# Patient Record
Sex: Female | Born: 1985 | Race: White | Hispanic: No | Marital: Married | Smoking: Former smoker
Health system: Southern US, Community
[De-identification: ages and names within clinical notes are randomized; demographics above are authoritative.]

## PROBLEM LIST (undated history)

## (undated) DIAGNOSIS — K219 Gastro-esophageal reflux disease without esophagitis: Secondary | ICD-10-CM

## (undated) DIAGNOSIS — F419 Anxiety disorder, unspecified: Secondary | ICD-10-CM

## (undated) DIAGNOSIS — I10 Essential (primary) hypertension: Secondary | ICD-10-CM

## (undated) DIAGNOSIS — F329 Major depressive disorder, single episode, unspecified: Secondary | ICD-10-CM

## (undated) DIAGNOSIS — F32A Depression, unspecified: Secondary | ICD-10-CM

## (undated) DIAGNOSIS — E7800 Pure hypercholesterolemia, unspecified: Secondary | ICD-10-CM

## (undated) DIAGNOSIS — D649 Anemia, unspecified: Secondary | ICD-10-CM

## (undated) DIAGNOSIS — E039 Hypothyroidism, unspecified: Secondary | ICD-10-CM

## (undated) DIAGNOSIS — E669 Obesity, unspecified: Secondary | ICD-10-CM

## (undated) HISTORY — DX: Major depressive disorder, single episode, unspecified: F32.9

## (undated) HISTORY — PX: ABDOMINAL HYSTERECTOMY: SHX81

## (undated) HISTORY — PX: MANDIBLE FRACTURE SURGERY: SHX706

## (undated) HISTORY — PX: BREAST BIOPSY: SHX20

## (undated) HISTORY — DX: Anxiety disorder, unspecified: F41.9

## (undated) HISTORY — DX: Obesity, unspecified: E66.9

## (undated) HISTORY — DX: Depression, unspecified: F32.A

---

## 2004-07-22 ENCOUNTER — Emergency Department: Payer: Self-pay | Admitting: Emergency Medicine

## 2004-07-24 ENCOUNTER — Emergency Department: Payer: Self-pay | Admitting: Emergency Medicine

## 2006-11-22 ENCOUNTER — Emergency Department: Payer: Self-pay | Admitting: Unknown Physician Specialty

## 2007-02-25 ENCOUNTER — Emergency Department: Payer: Self-pay | Admitting: Unknown Physician Specialty

## 2007-03-08 ENCOUNTER — Emergency Department: Payer: Self-pay | Admitting: Emergency Medicine

## 2007-03-10 ENCOUNTER — Emergency Department: Payer: Self-pay | Admitting: Emergency Medicine

## 2007-12-04 ENCOUNTER — Emergency Department: Payer: Self-pay | Admitting: Emergency Medicine

## 2008-03-06 ENCOUNTER — Emergency Department: Payer: Self-pay | Admitting: Emergency Medicine

## 2008-09-25 ENCOUNTER — Emergency Department: Payer: Self-pay | Admitting: Emergency Medicine

## 2008-09-30 ENCOUNTER — Emergency Department: Payer: Self-pay | Admitting: Emergency Medicine

## 2008-12-30 ENCOUNTER — Emergency Department: Payer: Self-pay | Admitting: Unknown Physician Specialty

## 2009-02-22 ENCOUNTER — Emergency Department: Payer: Self-pay | Admitting: Unknown Physician Specialty

## 2009-02-22 ENCOUNTER — Ambulatory Visit: Payer: Self-pay | Admitting: Family Medicine

## 2009-03-10 ENCOUNTER — Emergency Department: Payer: Self-pay | Admitting: Emergency Medicine

## 2009-08-02 ENCOUNTER — Ambulatory Visit: Payer: Self-pay

## 2009-08-16 ENCOUNTER — Encounter: Payer: Self-pay | Admitting: Maternal and Fetal Medicine

## 2009-08-17 ENCOUNTER — Ambulatory Visit: Payer: Self-pay | Admitting: Maternal and Fetal Medicine

## 2009-08-23 ENCOUNTER — Encounter: Payer: Self-pay | Admitting: Maternal & Fetal Medicine

## 2009-09-20 ENCOUNTER — Encounter: Payer: Self-pay | Admitting: Maternal & Fetal Medicine

## 2009-10-22 ENCOUNTER — Ambulatory Visit: Payer: Self-pay

## 2009-10-23 ENCOUNTER — Inpatient Hospital Stay: Payer: Self-pay | Admitting: Obstetrics & Gynecology

## 2010-07-06 ENCOUNTER — Emergency Department: Payer: Self-pay | Admitting: Internal Medicine

## 2010-09-27 ENCOUNTER — Emergency Department: Payer: Self-pay | Admitting: Emergency Medicine

## 2011-01-12 IMAGING — US US EXTREM LOW VENOUS*L*
1 series · 17 of 24 positions shown · non-contrast
Comparison: none

REASON FOR EXAM: swelling left leg pt preg  eval dvt
COMMENTS:

[Series 1: us extrem low venous*left* · 17 of 28 slices shown]
[im 1/28]
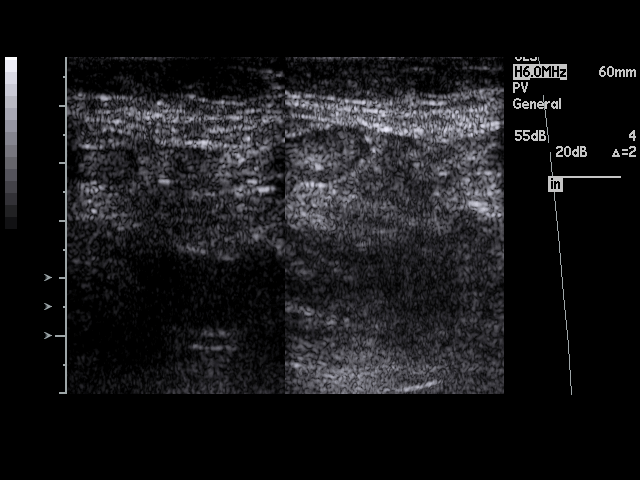
[im 3/28]
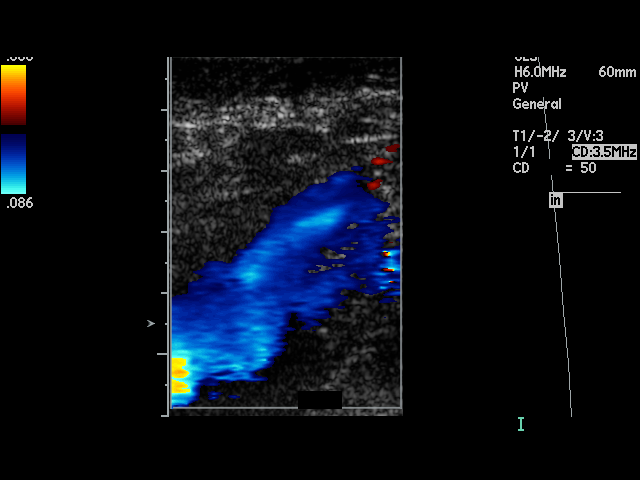
[im 4/28]
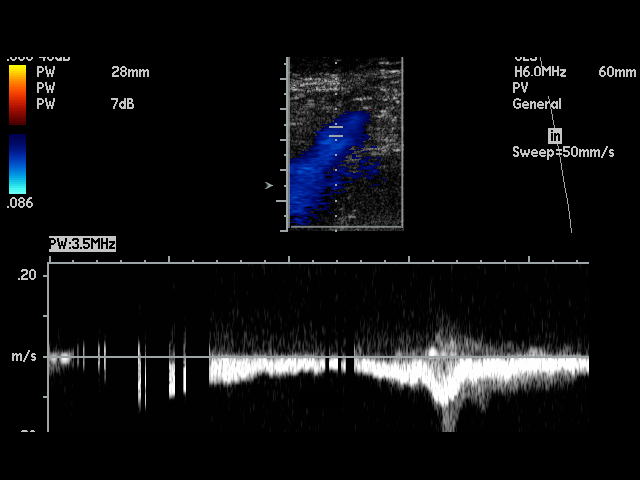
[im 5/28]
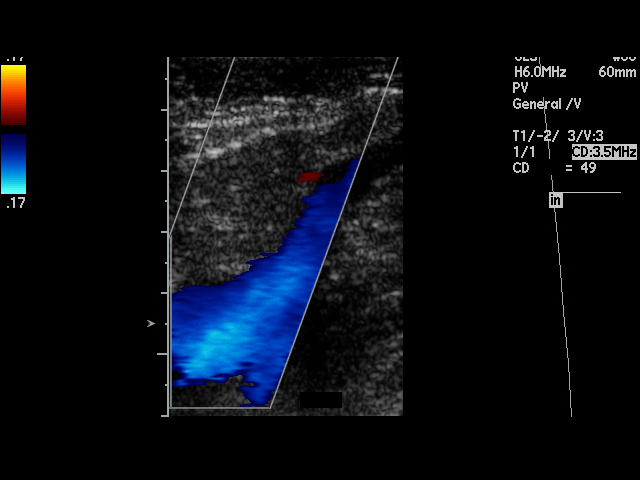
[im 8/28]
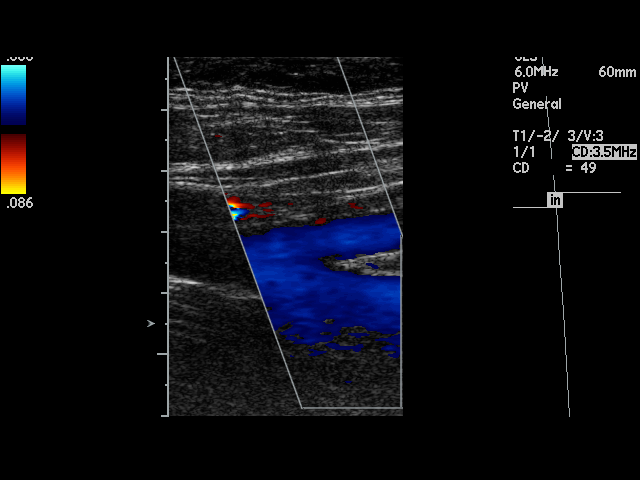
[im 9/28]
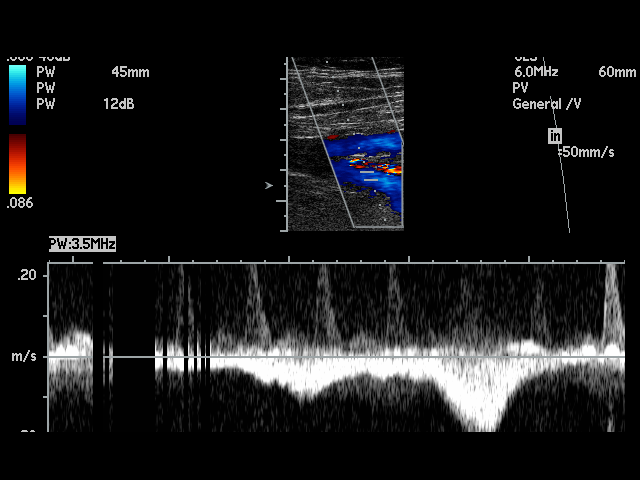
[im 11/28]
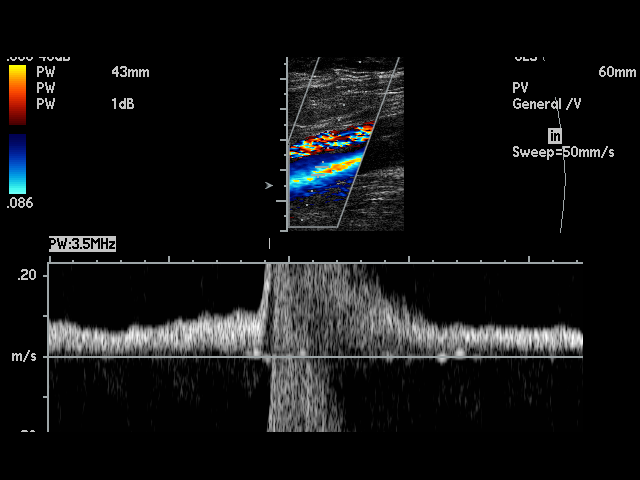
[im 12/28]
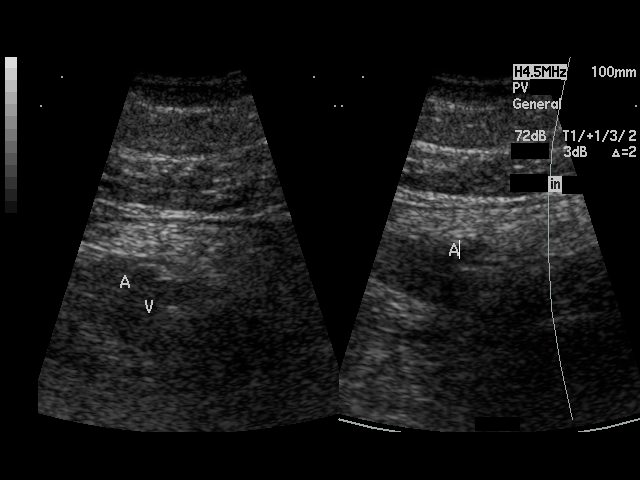
[im 15/28]
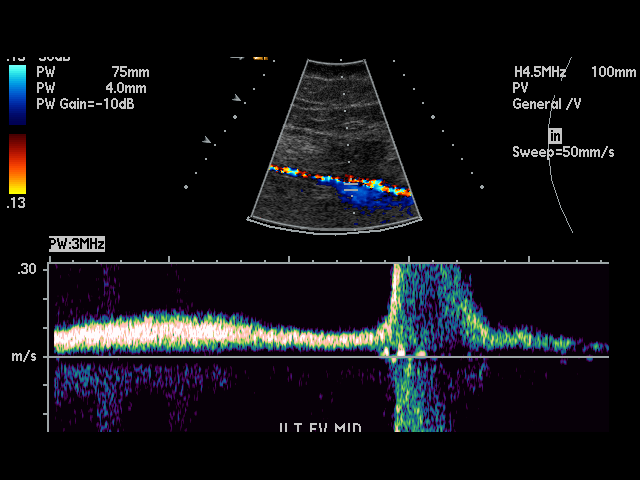
[im 16/28]
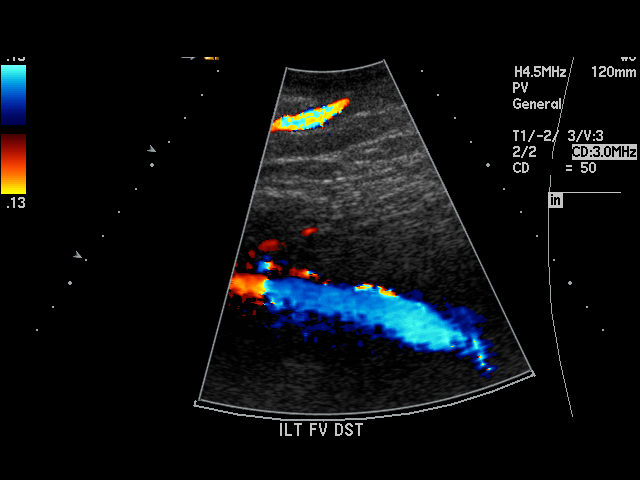
[im 17/28]
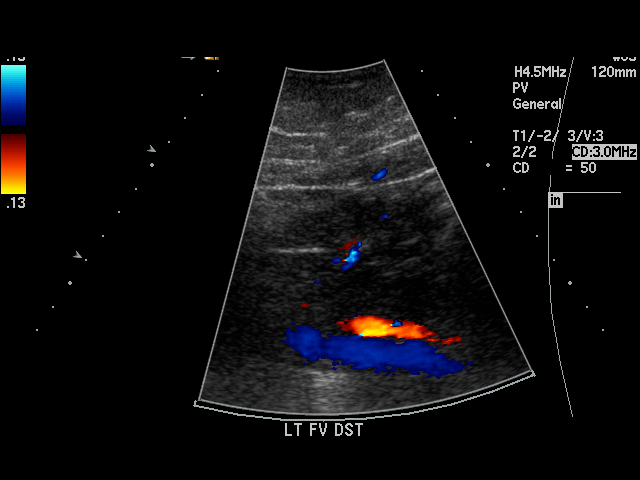
[im 19/28]
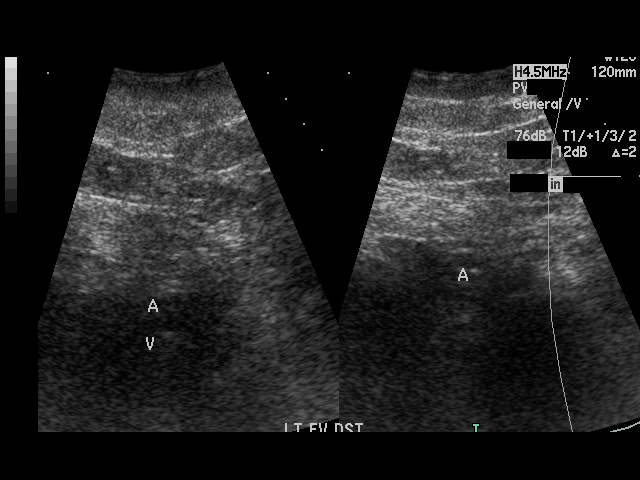
[im 20/28]
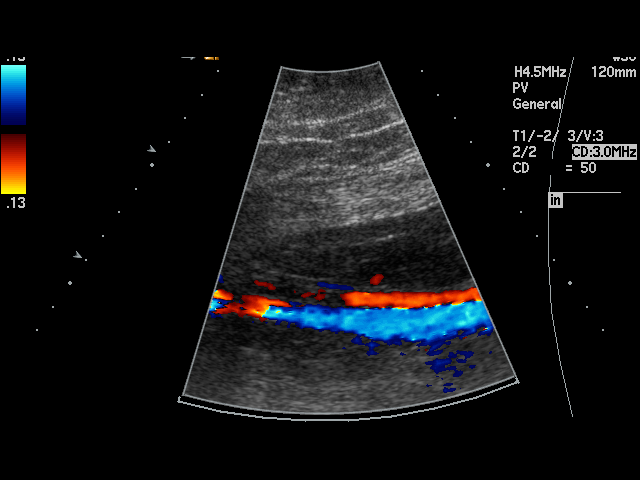
[im 23/28]
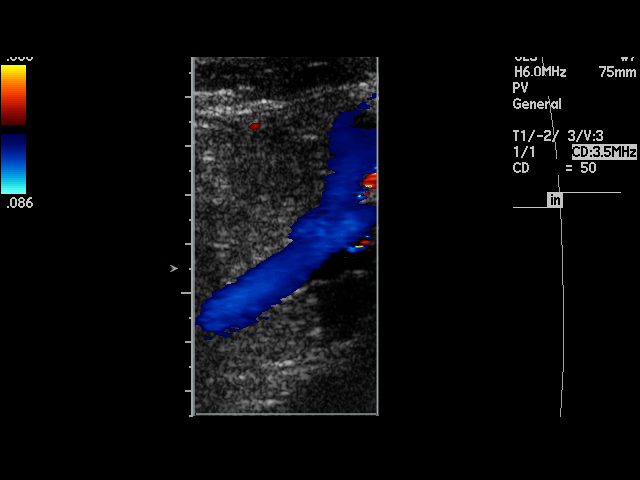
[im 24/28]
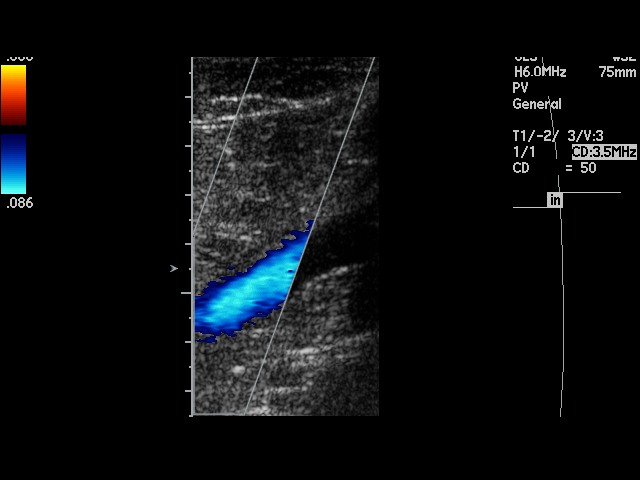
[im 25/28]
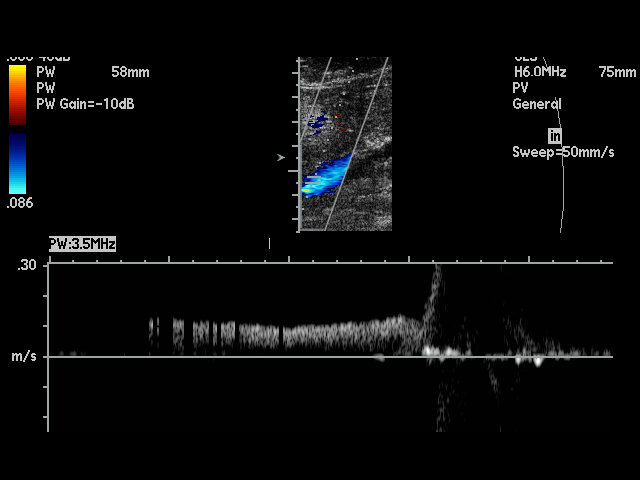
[im 28/28]
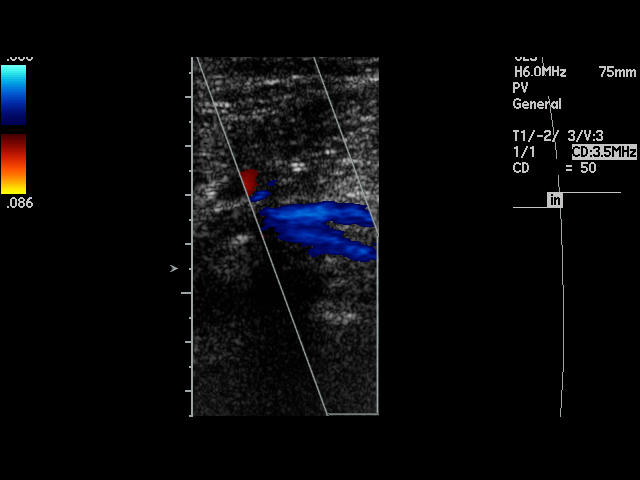

[17 of 24 positions shown; findings below may reference images not displayed]

PROCEDURE:     US  - US DOPPLER LOW EXTR LEFT  - August 02, 2009  [DATE]

RESULT:     Technique: Gray scale, Duplex color flow and SPECTRAL waveform
imaging was performed of the deep venous structures of the LEFT lower
extremity.

There is not evidence of increased echogenicity, non-compressibility,
abnormal waveform or abnormal grayscale flow with the interrogated deep
venous structures of the LEFT lower extremity. There is appropriate response
to Valsalva and augmentation within the interrogated vessels.
IMPRESSION: 1. No sonographic evidence of a deep venous thrombus within the interrogated
vessels of the LEFT lower extremity.

## 2011-01-19 ENCOUNTER — Emergency Department: Payer: Self-pay | Admitting: Emergency Medicine

## 2011-01-21 ENCOUNTER — Emergency Department: Payer: Self-pay | Admitting: Unknown Physician Specialty

## 2011-06-19 ENCOUNTER — Emergency Department: Payer: Self-pay | Admitting: Emergency Medicine

## 2011-08-29 ENCOUNTER — Emergency Department: Payer: Self-pay | Admitting: Emergency Medicine

## 2014-03-24 ENCOUNTER — Ambulatory Visit: Payer: Self-pay | Admitting: Obstetrics and Gynecology

## 2014-04-19 ENCOUNTER — Ambulatory Visit: Payer: Self-pay | Admitting: Obstetrics and Gynecology

## 2016-05-27 ENCOUNTER — Other Ambulatory Visit
Admission: RE | Admit: 2016-05-27 | Discharge: 2016-05-27 | Disposition: A | Discharge status: Home / Self Care | Payer: Self-pay | Source: Other Acute Inpatient Hospital | Attending: Internal Medicine | Admitting: Internal Medicine

## 2016-05-27 ENCOUNTER — Other Ambulatory Visit
Admission: RE | Admit: 2016-05-27 | Discharge: 2016-05-27 | Disposition: A | Discharge status: Home / Self Care | Payer: Worker's Compensation | Source: Ambulatory Visit | Attending: Internal Medicine | Admitting: Internal Medicine

## 2016-05-27 DIAGNOSIS — Z578 Occupational exposure to other risk factors: Secondary | ICD-10-CM | POA: Insufficient documentation

## 2016-05-27 LAB — RAPID HIV SCREEN (HIV 1/2 AB+AG)
HIV 1/2 ANTIBODIES: NONREACTIVE
HIV-1 P24 ANTIGEN - HIV24: NONREACTIVE

## 2016-05-29 LAB — HEPATITIS B SURFACE ANTIBODY, QUANTITATIVE: HEPATITIS B-POST: 3.7 m[IU]/mL — AB

## 2016-05-29 LAB — HEPATITIS C ANTIBODY: HCV Ab: <0.1 s/co ratio (ref 0.0–0.9)

## 2016-06-27 ENCOUNTER — Other Ambulatory Visit
Admission: RE | Admit: 2016-06-27 | Discharge: 2016-06-27 | Disposition: A | Discharge status: Home / Self Care | Payer: Worker's Compensation | Source: Ambulatory Visit | Attending: Internal Medicine | Admitting: Internal Medicine

## 2016-06-27 DIAGNOSIS — Z578 Occupational exposure to other risk factors: Secondary | ICD-10-CM | POA: Insufficient documentation

## 2016-06-28 LAB — HEPATITIS C ANTIBODY: HCV Ab: <0.1 s/co ratio (ref 0.0–0.9)

## 2016-07-11 LAB — HM PAP SMEAR: HM Pap smear: NEGATIVE

## 2017-08-24 ENCOUNTER — Ambulatory Visit: Payer: Self-pay | Admitting: Obstetrics & Gynecology

## 2017-09-16 ENCOUNTER — Encounter: Payer: Self-pay | Admitting: Obstetrics & Gynecology

## 2017-09-16 ENCOUNTER — Ambulatory Visit (INDEPENDENT_AMBULATORY_CARE_PROVIDER_SITE_OTHER): Payer: BLUE CROSS/BLUE SHIELD | Admitting: Obstetrics & Gynecology

## 2017-09-16 VITALS — BP 120/70 | Ht 65.0 in | Wt 231.0 lb

## 2017-09-16 DIAGNOSIS — Z0000 Encounter for general adult medical examination without abnormal findings: Secondary | ICD-10-CM

## 2017-09-16 DIAGNOSIS — Z124 Encounter for screening for malignant neoplasm of cervix: Secondary | ICD-10-CM

## 2017-09-16 DIAGNOSIS — Z01419 Encounter for gynecological examination (general) (routine) without abnormal findings: Secondary | ICD-10-CM | POA: Diagnosis not present

## 2017-09-16 NOTE — Patient Instructions (Signed)
PAP every three years Labs yearly (with PCP)   

## 2017-09-16 NOTE — Progress Notes (Signed)
HPI:      [name] is a 31 y.o. G2P2002 who LMP was Patient's last menstrual period was 08/21/2017., she presents today for her annual examination. The patient has no complaints today. The patient is sexually active. Her last pap: approximate date 2017 and was normal. The patient does perform self breast exams.  There is no notable family history of breast or ovarian cancer in her family.  The patient has regular exercise: yes.  The patient denies current symptoms of depression.    GYN History: Contraception: none  PMHx: Past Medical History:  Diagnosis Date  . Anxiety   . Depression   . Obesity    Past Surgical History:  Procedure Laterality Date  . MANDIBLE FRACTURE SURGERY     Family History  Problem Relation Age of Onset  . Atrial fibrillation Mother   . Lung cancer Mother   . Heart attack Father        cardiac arrest   Social History   Tobacco Use  . Smoking status: Never Smoker  . Smokeless tobacco: Never Used  Substance Use Topics  . Alcohol use: No    Frequency: Never  . Drug use: No    Current Outpatient Medications:  .  hydrochlorothiazide (HYDRODIURIL) 25 MG tablet, Take by mouth., Disp: , Rfl:  .  labetalol (NORMODYNE) 200 MG tablet, Take by mouth., Disp: , Rfl:  .  metFORMIN (GLUCOPHAGE-XR) 750 MG 24 hr tablet, Take by mouth., Disp: , Rfl:  Allergies: Clindamycin  Review of Systems  Constitutional: Negative for chills, fever and malaise/fatigue.  HENT: Negative for congestion, sinus pain and sore throat.   Eyes: Negative for blurred vision and pain.  Respiratory: Negative for cough and wheezing.   Cardiovascular: Negative for chest pain and leg swelling.  Gastrointestinal: Negative for abdominal pain, constipation, diarrhea, heartburn, nausea and vomiting.  Genitourinary: Negative for dysuria, frequency, hematuria and urgency.  Musculoskeletal: Negative for back pain, joint pain, myalgias and neck pain.  Skin: Negative for itching and rash.    Neurological: Negative for dizziness, tremors and weakness.  Endo/Heme/Allergies: Does not bruise/bleed easily.  Psychiatric/Behavioral: Negative for depression. The patient is not nervous/anxious and does not have insomnia.    Objective: BP 120/70   Ht 5\' 5"  (1.651 m)   Wt 231 lb (104.8 kg)   LMP 08/21/2017   BMI 38.44 kg/m   Filed Weights   09/16/17 1334  Weight: 231 lb (104.8 kg)   Body mass index is 38.44 kg/m. Physical Exam  Constitutional: She is oriented to person, place, and time. She appears well-developed and well-nourished. No distress.  Genitourinary: Rectum normal, vagina normal and uterus normal. Pelvic exam was performed with patient supine. There is no rash or lesion on the right labia. There is no rash or lesion on the left labia. Vagina exhibits no lesion. No bleeding in the vagina. Right adnexum does not display mass and does not display tenderness. Left adnexum does not display mass and does not display tenderness. Cervix does not exhibit motion tenderness, lesion, friability or polyp.   Uterus is mobile and midaxial. Uterus is not enlarged or exhibiting a mass.  HENT:  Head: Normocephalic and atraumatic. Head is without laceration.  Right Ear: Hearing normal.  Left Ear: Hearing normal.  Nose: No epistaxis.  No foreign bodies.  Mouth/Throat: Uvula is midline, oropharynx is clear and moist and mucous membranes are normal.  Eyes: Pupils are equal, round, and reactive to light.  Neck: Normal range of motion.  Neck supple. No thyromegaly present.  Cardiovascular: Normal rate and regular rhythm. Exam reveals no gallop and no friction rub.  No murmur heard. Pulmonary/Chest: Effort normal and breath sounds normal. No respiratory distress. She has no wheezes. Right breast exhibits no mass, no skin change and no tenderness. Left breast exhibits no mass, no skin change and no tenderness.  Abdominal: Soft. Bowel sounds are normal. She exhibits no distension. There is no  tenderness. There is no rebound.  Musculoskeletal: Normal range of motion.  Neurological: She is alert and oriented to person, place, and time. No cranial nerve deficit.  Skin: Skin is warm and dry.  Psychiatric: She has a normal mood and affect. Judgment normal.  Vitals reviewed.  Assessment: 1. Annual physical exam    Screening Plan:            1.  Cervical Screening-  Pap smear schedule reviewed with patient, PAP today  2. Breast screening- Exam annually and mammogram>40 planned   3. Colonoscopy every 10 years, Hemoccult testing - after age 150  4. Labs managed by PCP  5. Counseling for contraception: no method[facility]; considering [facility] after she turns 33    F/U  Return in about 1 year (around 09/16/2018) for Annual.  Annamarie MajorPaul Terralyn Matsumura, MD, Merlinda FrederickFACOG [facility], [facility] 09/16/2017  1:55 PM

## 2017-09-19 LAB — IGP, APTIMA HPV
HPV Aptima: NEGATIVE
PAP SMEAR COMMENT: 0

## 2017-10-04 ENCOUNTER — Emergency Department
Admission: EM | Admit: 2017-10-04 | Discharge: 2017-10-04 | Disposition: A | Discharge status: Home / Self Care | Payer: Self-pay | Attending: Emergency Medicine | Admitting: Emergency Medicine

## 2017-10-04 ENCOUNTER — Encounter: Payer: Self-pay | Admitting: Emergency Medicine

## 2017-10-04 DIAGNOSIS — H6982 Other specified disorders of Eustachian tube, left ear: Secondary | ICD-10-CM | POA: Insufficient documentation

## 2017-10-04 DIAGNOSIS — H9212 Otorrhea, left ear: Secondary | ICD-10-CM | POA: Insufficient documentation

## 2017-10-04 DIAGNOSIS — Z7984 Long term (current) use of oral hypoglycemic drugs: Secondary | ICD-10-CM | POA: Insufficient documentation

## 2017-10-04 DIAGNOSIS — Z87891 Personal history of nicotine dependence: Secondary | ICD-10-CM | POA: Insufficient documentation

## 2017-10-04 MED ORDER — PSEUDOEPHEDRINE HCL ER 120 MG PO TB12: 120.0000 mg | ORAL_TABLET | Freq: Two times a day (BID) | ORAL | 0 refills | Status: DC | PRN

## 2017-10-04 NOTE — Discharge Instructions (Signed)
Advised to follow-up with ENT clinic if bleeding recurs.
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
## 2017-10-04 NOTE — ED Triage Notes (Signed)
Patient with complaint of left ear pain with some bleeding that started this morning.

## 2018-01-15 ENCOUNTER — Encounter: Payer: Self-pay | Admitting: Obstetrics and Gynecology

## 2018-01-15 ENCOUNTER — Ambulatory Visit (INDEPENDENT_AMBULATORY_CARE_PROVIDER_SITE_OTHER): Payer: BLUE CROSS/BLUE SHIELD | Admitting: Obstetrics and Gynecology

## 2018-01-15 VITALS — BP 110/80 | HR 91 | Ht 65.0 in | Wt 209.0 lb

## 2018-01-15 DIAGNOSIS — N939 Abnormal uterine and vaginal bleeding, unspecified: Secondary | ICD-10-CM | POA: Diagnosis not present

## 2018-01-15 NOTE — Progress Notes (Signed)
Patient ID: Sandra Sanchez, female   DOB: 08/17/1986, 32 y.o.   MRN: [id number]  Reason for Consult: Menstrual Problem (period lasting all month) and Dyspareunia   Referred by No ref. provider found  Subjective:     HPI:  Sandra Sanchez is a 32 y.o. female patient presents today complaining of one month of irregular vaginal bleeding. She states that in the month of March she only had 8 days without bleeding. She describes the bleeding as heavy spotting. She did not fill any pads but it was more bleeding that a liner. She says it fluctuated between bright red and brown. She reports that the bleeding often followed intercourse then lasted for many days. She reports that she recently had discomfort with intercourse. She has not tried any therapies for the bleeding. She has not previously been evaluated for the bleeding. Patient reports that she had a recent intentional weight loss of over 120 lbs. She says that her weight loss began in January of 2017. She says that since loosing weight she has had regular monthly menses until now. Menarche was at 8. She did have irregular periods. For several years and describes that for an entire year before becoming pregnant with her daughter she did not have any periods.  She denies dysmenorrhea.   Past Medical History:  Diagnosis Date  . Anxiety   . Depression   . Obesity    Family History  Problem Relation Age of Onset  . Atrial fibrillation Mother   . Lung cancer Mother   . Heart attack Father        cardiac arrest   Past Surgical History:  Procedure Laterality Date  . MANDIBLE FRACTURE SURGERY      Short Social History:  Social History   Tobacco Use  . Smoking status: Former Games developer  . Smokeless tobacco: Never Used  Substance Use Topics  . Alcohol use: Yes    Frequency: Never    Comment: rarely    Allergies  Allergen Reactions  . Clindamycin Hives    Current Outpatient Medications  Medication Sig Dispense Refill  .  hydrochlorothiazide (HYDRODIURIL) 25 MG tablet Take by mouth.    . labetalol (NORMODYNE) 200 MG tablet Take by mouth.    . metFORMIN (GLUCOPHAGE-XR) 750 MG 24 hr tablet Take by mouth.    . pseudoephedrine (SUDAFED) 120 MG 12 hr tablet Take 1 tablet (120 mg total) by mouth 2 (two) times daily as needed for congestion. (Patient not taking: Reported on 01/15/2018) 20 tablet 0   No current facility-administered medications for this visit.     Review of Systems  Constitutional: Negative for chills, fatigue, fever and unexpected weight change.  HENT: Negative for trouble swallowing.  Eyes: Negative for loss of vision.  Respiratory: Negative for cough, shortness of breath and wheezing.  Cardiovascular: Negative for chest pain, leg swelling, palpitations and syncope.  GI: Negative for abdominal pain, blood in stool, diarrhea, nausea and vomiting.  GU: Negative for difficulty urinating, dysuria, frequency and hematuria.  Musculoskeletal: Negative for back pain, leg pain and joint pain.  Skin: Negative for rash.  Neurological: Negative for dizziness, headaches, light-headedness, numbness and seizures.  Psychiatric: Negative for behavioral problem, confusion, depressed mood and sleep disturbance.        Objective:  Objective   Vitals:   01/15/18 1442  BP: 110/80  Pulse: 91  Weight: 209 lb (94.8 kg)  Height: 5\' 5"  (1.651 m)   Body mass index is 34.78 kg/m.  Physical Exam  Constitutional: She is oriented to person, place, and time. She appears well-developed and well-nourished.  HENT:  Head: Normocephalic and atraumatic.  Eyes: EOM are normal.  Cardiovascular: Normal rate, regular rhythm and normal heart sounds.  Pulmonary/Chest: Effort normal and breath sounds normal.  Abdominal: Hernia confirmed negative in the right inguinal area and confirmed negative in the left inguinal area.  Genitourinary: Vagina normal and uterus normal. Rectal exam shows no external hemorrhoid. No labial  fusion. There is no rash, tenderness, lesion or injury on the right labia. There is no rash, tenderness, lesion or injury on the left labia. Cervix exhibits no motion tenderness, no discharge and no friability. Right adnexum displays no mass, no tenderness and no fullness. Left adnexum displays no mass, no tenderness and no fullness. No erythema, tenderness or bleeding in the vagina. No foreign body in the vagina. No vaginal discharge found.  Genitourinary Comments: Normal cervix. No cervical polyps of lesions seen. No adnexal masses.  Scant blood in vault, no active bleeding.   Neurological: She is alert and oriented to person, place, and time.  Skin: Skin is warm and dry.  Psychiatric: She has a normal mood and affect. Her behavior is normal. Judgment and thought content normal.  Nursing note and vitals reviewed.       Assessment/Plan:     31yo [id number] with abnormal uterine bleeding for 1 month.  1. Abnormal uterine bleeding characterized by Morrie SheldonAshley as "heavy spotting." Will have patient follow up for saline infusion sonohysterography to evaluate for endometrial polyp.  Discussed hormonal management but the patient stated she did not want to treated with hormones. Discussed that if the US showed a thickened endometrium or a polyp that a hysteroscopy dilation and curettage could be performed.       Natale Milchhristanna R Bethel Gaglio MD [facility], [facility] 01/15/18 3:50 PM

## 2018-02-01 ENCOUNTER — Other Ambulatory Visit: Payer: BLUE CROSS/BLUE SHIELD

## 2018-02-01 ENCOUNTER — Ambulatory Visit: Payer: BLUE CROSS/BLUE SHIELD | Admitting: Obstetrics and Gynecology

## 2018-03-25 ENCOUNTER — Emergency Department: Payer: BLUE CROSS/BLUE SHIELD

## 2018-03-25 ENCOUNTER — Emergency Department
Admission: EM | Admit: 2018-03-25 | Discharge: 2018-03-25 | Disposition: A | Discharge status: Home / Self Care | Payer: BLUE CROSS/BLUE SHIELD | Attending: Emergency Medicine | Admitting: Emergency Medicine

## 2018-03-25 ENCOUNTER — Other Ambulatory Visit: Payer: Self-pay

## 2018-03-25 ENCOUNTER — Encounter: Payer: Self-pay | Admitting: Medical Oncology

## 2018-03-25 DIAGNOSIS — S60051A Contusion of right little finger without damage to nail, initial encounter: Secondary | ICD-10-CM | POA: Insufficient documentation

## 2018-03-25 DIAGNOSIS — S60032A Contusion of left middle finger without damage to nail, initial encounter: Secondary | ICD-10-CM

## 2018-03-25 DIAGNOSIS — Z79899 Other long term (current) drug therapy: Secondary | ICD-10-CM | POA: Insufficient documentation

## 2018-03-25 DIAGNOSIS — Z87891 Personal history of nicotine dependence: Secondary | ICD-10-CM | POA: Diagnosis not present

## 2018-03-25 DIAGNOSIS — Y929 Unspecified place or not applicable: Secondary | ICD-10-CM | POA: Insufficient documentation

## 2018-03-25 DIAGNOSIS — Y998 Other external cause status: Secondary | ICD-10-CM | POA: Insufficient documentation

## 2018-03-25 DIAGNOSIS — W228XXA Striking against or struck by other objects, initial encounter: Secondary | ICD-10-CM | POA: Diagnosis not present

## 2018-03-25 DIAGNOSIS — S6992XA Unspecified injury of left wrist, hand and finger(s), initial encounter: Secondary | ICD-10-CM | POA: Diagnosis present

## 2018-03-25 DIAGNOSIS — Y9389 Activity, other specified: Secondary | ICD-10-CM | POA: Diagnosis not present

## 2018-03-25 DIAGNOSIS — S60413A Abrasion of left middle finger, initial encounter: Secondary | ICD-10-CM | POA: Diagnosis not present

## 2018-03-25 MED ORDER — HYDROCODONE-ACETAMINOPHEN 5-325 MG PO TABS: 1.0000 | ORAL_TABLET | Freq: Four times a day (QID) | ORAL | 0 refills | Status: DC | PRN

## 2018-03-25 NOTE — Discharge Instructions (Signed)
[facility] if any continued problems.  Ice and elevate as needed for swelling and pain.  Wear finger splint on your third finger for support and protection.  Watch abrasion for any signs of infection.  Clean this area daily with mild soap and water.  Continue taking ibuprofen 3 tablets with food 3 times a day.  You may also take Norco if needed for severe pain.  Be aware that you cannot take this medication and drive or operate machinery as it could cause drowsiness and increase your risk for injury.
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
## 2018-09-22 ENCOUNTER — Encounter: Payer: Self-pay | Admitting: Obstetrics and Gynecology

## 2018-09-22 ENCOUNTER — Ambulatory Visit (INDEPENDENT_AMBULATORY_CARE_PROVIDER_SITE_OTHER): Payer: BLUE CROSS/BLUE SHIELD | Admitting: Obstetrics and Gynecology

## 2018-09-22 VITALS — BP 118/78 | HR 88 | Ht 65.0 in | Wt 224.0 lb

## 2018-09-22 DIAGNOSIS — N921 Excessive and frequent menstruation with irregular cycle: Secondary | ICD-10-CM | POA: Diagnosis not present

## 2018-09-22 DIAGNOSIS — N939 Abnormal uterine and vaginal bleeding, unspecified: Secondary | ICD-10-CM | POA: Diagnosis not present

## 2018-09-22 DIAGNOSIS — Z01419 Encounter for gynecological examination (general) (routine) without abnormal findings: Secondary | ICD-10-CM | POA: Diagnosis not present

## 2018-09-22 DIAGNOSIS — Z0000 Encounter for general adult medical examination without abnormal findings: Secondary | ICD-10-CM

## 2018-09-22 NOTE — Progress Notes (Signed)
Gynecology Annual Exam   PCP: Patient, No Pcp Per  Chief Complaint:  Chief Complaint  Patient presents with  . Gynecologic Exam    started period on Oct12th and period ended 09/19/18    History of Present Illness: Patient is a 32 y.o. [id number] presents for annual exam. The patient reports today that after her visit last March her abnormal bleeding stopped for several months. She then started having bleeding again in October. From October 12th until 09/19/18 she had almost daily bleeding and spotting. It was heavy right before it stopped a few days ago. She denies dysmenorrhea. She denies faintness or dizziness.  She also reports that she has had more than one episode of postcoital bleeding and spotting.  "Sex seems to make the bleeding worse."  She is not a fan of hormone based medications. She said that she would rather deal with the bleeding as long as she isn't anemic than take hormones. She feels that hormonal medications impact her mood, cause break outs, and have not helped her much in the past.   LMP: Patient's last menstrual period was 07/31/2018 (exact date). Average Interval: regular, 28 days Duration of flow: variableHeavy Menses: yes Clots: no Intermenstrual Bleeding: yes Postcoital Bleeding: yes Dysmenorrhea: no  The patient is sexually active. She currently uses none for contraception. She denies dyspareunia.  The patient does perform self breast exams.  There is no notable family history of breast or ovarian cancer in her family.  The patient wears seatbelts: yes.   The patient has regular exercise: no.    The patient reports current symptoms of depression.  She is managed for her depression by her psychiatrist. She recently has effexor added and she feels like this medication helps her.    Review of Systems: Review of Systems  Constitutional: Positive for malaise/fatigue.  HENT: Positive for congestion and sore throat.   Respiratory: Positive for cough.     Psychiatric/Behavioral: Positive for depression. The patient is nervous/anxious.     Past Medical History:  Past Medical History:  Diagnosis Date  . Anxiety   . Depression   . Obesity     Past Surgical History:  Past Surgical History:  Procedure Laterality Date  . MANDIBLE FRACTURE SURGERY      Gynecologic History:  Patient's last menstrual period was 07/31/2018 (exact date). Contraception: none Last Pap: Results were: NIL and HR HPV negative   Obstetric History: [id number]  Family History:  Family History  Problem Relation Age of Onset  . Atrial fibrillation Mother   . Lung cancer Mother   . Heart attack Father        cardiac arrest    Social History:  Social History   Socioeconomic History  . Marital status: Married    Spouse name: Not on file  . Number of children: Not on file  . Years of education: Not on file  . Highest education level: Not on file  Occupational History  . Not on file  Social Needs  . Financial resource strain: Not on file  . Food insecurity:    Worry: Not on file    Inability: Not on file  . Transportation needs:    Medical: Not on file    Non-medical: Not on file  Tobacco Use  . Smoking status: Former Games developermoker  . Smokeless tobacco: Never Used  Substance and Sexual Activity  . Alcohol use: Yes    Frequency: Never    Comment: rarely  . Drug use:  No  . Sexual activity: Yes    Birth control/protection: None  Lifestyle  . Physical activity:    Days per week: Not on file    Minutes per session: Not on file  . Stress: Not on file  Relationships  . Social connections:    Talks on phone: Not on file    Gets together: Not on file    Attends religious service: Not on file    Active member of club or organization: Not on file    Attends meetings of clubs or organizations: Not on file    Relationship status: Not on file  . Intimate partner violence:    Fear of current or ex partner: Not on file    Emotionally abused: Not on file     Physically abused: Not on file    Forced sexual activity: Not on file  Other Topics Concern  . Not on file  Social History Narrative  . Not on file    Allergies:  Allergies  Allergen Reactions  . Clindamycin Hives    Medications: Prior to Admission medications   Medication Sig Start Date End Date Taking? Authorizing Provider  atorvastatin (LIPITOR) 40 MG tablet Take 40 mg by mouth daily. 09/03/18  Yes [provider]  buPROPion (WELLBUTRIN XL) 150 MG 24 hr tablet Take by mouth. 06/16/18 06/16/19 Yes [provider]  hydrochlorothiazide (HYDRODIURIL) 25 MG tablet Take by mouth. 07/23/17  Yes [provider]  labetalol (NORMODYNE) 200 MG tablet Take by mouth. 03/30/17  Yes [provider]  metFORMIN (GLUCOPHAGE-XR) 750 MG 24 hr tablet Take by mouth. 03/01/18 03/01/19 Yes [provider]  Sod Fluoride-Potassium Nitrate (PREVIDENT 5000 SENSITIVE) 1.1-5 % PSTE PLEASE SEE ATTACHED FOR DETAILED DIRECTIONS 06/28/18  Yes [provider]  venlafaxine XR (EFFEXOR-XR) 37.5 MG 24 hr capsule Take by mouth. 07/14/18 07/14/19 Yes [provider]    Physical Exam Vitals: Blood pressure 118/78, pulse 88, height 5\' 5"  (1.651 m), weight 224 lb (101.6 kg), last menstrual period 07/31/2018.  General: NAD HEENT: normocephalic, anicteric Thyroid: no enlargement, no palpable nodules Pulmonary: No increased work of breathing, CTAB Cardiovascular: RRR, distal pulses 2+ Breast: Breast symmetrical, no tenderness, no palpable nodules or masses, no skin or nipple retraction present, no nipple discharge.  No axillary or supraclavicular lymphadenopathy. Abdomen: NABS, soft, non-tender, non-distended.  Umbilicus without lesions.  No hepatomegaly, splenomegaly or masses palpable. No evidence of hernia  Genitourinary:  External: Normal external female genitalia.  Normal urethral meatus, normal Bartholin's and Skene's glands.    Vagina: Normal vaginal mucosa, no  evidence of prolapse.    Cervix: Grossly normal in appearance, no bleeding  Uterus: Non-enlarged, mobile, normal contour.  No CMT  Adnexa: ovaries non-enlarged, no adnexal masses  Rectal: deferred  Lymphatic: no evidence of inguinal lymphadenopathy Extremities: no edema, erythema, or tenderness Neurologic: Grossly intact Psychiatric: mood appropriate, affect full  Female chaperone present for pelvic and breast  portions of the physical exam    Assessment: 32 y.o. [id number] routine annual exam  Plan: Problem List Items Addressed This Visit    None    Visit Diagnoses    Abnormal uterine bleeding    -  Primary   Relevant Orders   US PELVIS TRANSVANGINAL NON-OB (TV ONLY)   Annual physical exam          2) STI screening  was offered and declined  2)  ASCCP guidelines and rational discussed.  Patient opts for every 3 years screening interval  3) Contraception - the patient is currently using  none.  She is not intereseted in hormonal or nonhormonal medications.  4) Routine healthcare maintenance including cholesterol, diabetes screening discussed managed by PCP  5) Menorrhagia- will have ehr return for a GYN [place]  6)  Return in about 1 week (around 09/29/2018) for return GYN and [place].  Adelene Idler MD [facility], [facility] 09/22/18 3:32 PM

## 2018-10-04 ENCOUNTER — Encounter: Payer: Self-pay | Admitting: Obstetrics and Gynecology

## 2018-10-04 ENCOUNTER — Ambulatory Visit (INDEPENDENT_AMBULATORY_CARE_PROVIDER_SITE_OTHER): Payer: BLUE CROSS/BLUE SHIELD | Admitting: Obstetrics and Gynecology

## 2018-10-04 ENCOUNTER — Ambulatory Visit (INDEPENDENT_AMBULATORY_CARE_PROVIDER_SITE_OTHER): Payer: BLUE CROSS/BLUE SHIELD

## 2018-10-04 VITALS — BP 112/70 | HR 86 | Ht 65.0 in | Wt 226.0 lb

## 2018-10-04 DIAGNOSIS — N83201 Unspecified ovarian cyst, right side: Secondary | ICD-10-CM | POA: Diagnosis not present

## 2018-10-04 DIAGNOSIS — N83291 Other ovarian cyst, right side: Secondary | ICD-10-CM

## 2018-10-04 DIAGNOSIS — N939 Abnormal uterine and vaginal bleeding, unspecified: Secondary | ICD-10-CM

## 2018-10-04 DIAGNOSIS — N921 Excessive and frequent menstruation with irregular cycle: Secondary | ICD-10-CM | POA: Diagnosis not present

## 2018-10-06 NOTE — Progress Notes (Signed)
Patient ID: Sandra Sanchez, female   DOB: [birth year], 32 y.o.   MRN: [id number]  Reason for Consult: Follow-up (bleed all of november, started spotting again 09/29/18 stopped on 10/02/18)   Referred by Schuman, Christanna R, *  Subjective:     HPI:  Sandra Sanchez is a 32 y.o. female She is following up today for menorrhagia. She has had continued abnormal bleeding. She is not interested in hormonal options for controlling this bleeding including an IUD, nexplanon, or oral contraceptive means. She reports today that she would like a tubal ligation. 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    Past Medical History:  Diagnosis Date  . Anemia   . Anxiety   . Depression   . GERD (gastroesophageal reflux disease)    RARE  . High cholesterol   . Hypertension   . Hypothyroidism    H/O WITH PREGNANCY ONLY  . Obesity    Family History  Problem Relation Age of Onset  . Atrial fibrillation Mother   . Lung cancer Mother   . Heart attack Father        cardiac arrest   Past Surgical History:  Procedure Laterality Date  . MANDIBLE FRACTURE SURGERY      Short Social History:  Social History   Tobacco Use  . Smoking status: Former Smoker    Packs/day: 1.00    Years: 15.00    Pack years: 15.00    Types: Cigarettes    Last attempt to quit: 02/18/2018    Years since quitting: 0.6  . Smokeless tobacco: Never Used  Substance Use Topics  . Alcohol use: Yes    Frequency: Never    Comment: rarely    Allergies  Allergen Reactions  . Clindamycin Hives    Current Outpatient Medications  Medication Sig Dispense Refill  . atorvastatin (LIPITOR) 40 MG tablet Take 40 mg by mouth at bedtime.   11  . buPROPion (WELLBUTRIN XL) 150 MG 24 hr tablet Take 150 mg by mouth every morning.     . hydrochlorothiazide (HYDRODIURIL) 25 MG tablet Take 25 mg by mouth daily.     Marland Kitchen labetalol (NORMODYNE) 200 MG tablet Take  400 mg by mouth at bedtime.     . metFORMIN (GLUCOPHAGE-XR) 750 MG 24 hr tablet Take 1,500 mg by mouth at bedtime.     Marland Kitchen venlafaxine XR (EFFEXOR-XR) 37.5 MG 24 hr capsule Take 37.5 mg by mouth daily with breakfast.     . Cholecalciferol (VITAMIN D) 50 MCG (2000 UT) tablet Take 2,000 Units by mouth at bedtime.    . famotidine (PEPCID) 10 MG tablet Take 10 mg by mouth as needed for heartburn or indigestion.    Marland Kitchen ibuprofen (ADVIL,MOTRIN) 200 MG tablet Take 400 mg by mouth every 6 (six) hours as needed.    Boris Lown Oil 1000 MG CAPS Take 1,000 mg by mouth daily.    . magnesium oxide (MAG-OX) 400 MG tablet Take 400 mg by mouth at bedtime.     . Potassium 99 MG TABS Take 2 tablets by mouth daily.    . Prenatal Vit-Fe Fumarate-FA (PRENATAL MULTIVITAMIN) TABS tablet Take 1 tablet by mouth at bedtime.     No current facility-administered medications for this visit.    Facility-Administered Medications Ordered in Other Visits  Medication Dose Route Frequency Provider Last Rate Last Dose  . cefoTEtan (CEFOTAN) 2 g in sodium chloride 0.9 %  100 mL IVPB  2 g Intravenous On Call to OR Schuman, Christanna R, MD        Review of Systems  Constitutional: Negative for chills, fatigue, fever and unexpected weight change.  HENT: Negative for trouble swallowing.  Eyes: Negative for loss of vision.  Respiratory: Negative for cough, shortness of breath and wheezing.  Cardiovascular: Negative for chest pain, leg swelling, palpitations and syncope.  GI: Negative for abdominal pain, blood in stool, diarrhea, nausea and vomiting.  GU: Negative for difficulty urinating, dysuria, frequency and hematuria.  Musculoskeletal: Negative for back pain, leg pain and joint pain.  Skin: Negative for rash.  Neurological: Negative for dizziness, headaches, light-headedness, numbness and seizures.  Psychiatric: Negative for behavioral problem, confusion, depressed mood and sleep disturbance.        Objective:  Objective    Vitals:   10/04/18 1550  BP: 112/70  Pulse: 86  Weight: 226 lb (102.5 kg)  Height: 5\' 5"  (1.651 m)   Body mass index is 37.61 kg/m.  Physical Exam Vitals signs and nursing note reviewed.  Constitutional:      Appearance: She is well-developed.  HENT:     Head: Normocephalic and atraumatic.  Eyes:     Pupils: Pupils are equal, round, and reactive to light.  Cardiovascular:     Rate and Rhythm: Normal rate and regular rhythm.  Pulmonary:     Effort: Pulmonary effort is normal. No respiratory distress.  Skin:    General: Skin is warm and dry.  Neurological:     Mental Status: She is alert and oriented to person, place, and time.  Psychiatric:        Behavior: Behavior normal.        Thought Content: Thought content normal.        Judgment: Judgment normal.        Assessment/Plan:     32 yo G2P2002 1. Abnormal uterine bleeding, menorrhagia with irregular cycle, long standing history of obesity and irregular cycles, endometrial sampling appropriate- patient declines any medical management of her irregular bleeding at this time. Will sample in the OR, declines office EMB.  2. Complex ovarian cyst- given that patient desires tubal ligation will evaluate in the OR at time of laparoscopy. 3. Desires sterilization- counseled about alternative options for birth control. She desires a tubal ligation. Will schedule and perform. Discussed that a bilateral salpingectomy confers a slightly decreased risk of future ovarian cancer but she declines a salpingectomy at this time. Discussed that tubal ligation should be considered permanent and that reversal of a tubal surgery is expensive and risks damage to the fallopian tube and tubal pregnancy. Discussed failure rate of 10/998  Will return for a preoperative appointment.  More than 25 minutes were spent face to face with the patient in the room with more than 50% of the time spent providing counseling and discussing the plan of management.    Adelene Idlerhristanna Schuman MD [facility], [facility] 10/26/2018 6:35 AM

## 2018-10-07 ENCOUNTER — Telehealth: Payer: Self-pay | Admitting: Obstetrics and Gynecology

## 2018-10-07 NOTE — Telephone Encounter (Signed)
-----   Message from Natale Milchhristanna R Schuman, MD sent at 10/06/2018  3:10 PM EST ----- Surgery Booking Request Patient Full Name:  Sandra Sanchez  MRN: [id number]  DOB: 06/27/1986  Surgeon: Natale Milchhristanna R Schuman, MD  Requested Surgery Date and Time: January 2020 Primary Diagnosis AND Code: Thickened endometrium, complex ovarian cyst, desire for sterilization. Secondary Diagnosis and Code:  Surgical Procedure: Hysteroscopy D&C, Laparoscopy, removal of right ovarian cyst, tubal ligation. L&D Notification: No Admission Status: same day surgery Length of Surgery: 2 hours Special Case Needs: none H&P: yes (date) Phone Interview???: yes Interpreter: Language:  Medical Clearance: no Special Scheduling Instructions: none

## 2018-10-07 NOTE — Telephone Encounter (Signed)
[facility] @ 4:10pm, Pre-admit Testing phone interview to be scheduled, and OR on 10/26/18. Patient [facility] Patient confirmed BCBS and no secondary insurance. Ext given.

## 2018-10-18 ENCOUNTER — Encounter: Payer: Self-pay | Admitting: Obstetrics and Gynecology

## 2018-10-18 ENCOUNTER — Ambulatory Visit (INDEPENDENT_AMBULATORY_CARE_PROVIDER_SITE_OTHER): Payer: BLUE CROSS/BLUE SHIELD | Admitting: Obstetrics and Gynecology

## 2018-10-18 VITALS — BP 120/80 | HR 87 | Ht 65.0 in | Wt 227.0 lb

## 2018-10-18 DIAGNOSIS — N83201 Unspecified ovarian cyst, right side: Secondary | ICD-10-CM

## 2018-10-18 NOTE — Progress Notes (Signed)
Patient ID: Sandra Sanchez, female   DOB: 11/07/1985, 32 y.o.   MRN: [id number]  Reason for Consult: Pre-op Exam (Irrgular bleeding )   Referred by No ref. provider found  Subjective:     HPI:  Sandra Filashley Gunderson is a 32 y.o. female . She is being seen today for her preoperative visit. She has been considering if she would like a sterilization procedure and she has decided that she would like to have a bilateral salpingectomy. She read about how this procedure can help lower her lifetime risk of ovarian cancer and this appeals to her.   She has continued to have irregular heavy bleeding and is also now interested in a Novasure ablation. She has expressed multiple times that she does not wasn't to take hormonal contraceptives to manage her bleeding and a Nexplanon and an IUD do not appeal to her either. Her sister has had a Novasure ablation and she would like the same thing. She understands that after an uterine ablation she could no longer carry a pregnancy. She reports that her children are 639 and 14 and she doe snot want to "start over."  Past Medical History:  Diagnosis Date  . Anxiety   . Depression   . Obesity    Family History  Problem Relation Age of Onset  . Atrial fibrillation Mother   . Lung cancer Mother   . Heart attack Father        cardiac arrest   Past Surgical History:  Procedure Laterality Date  . MANDIBLE FRACTURE SURGERY      Short Social History:  Social History   Tobacco Use  . Smoking status: Former Games developermoker  . Smokeless tobacco: Never Used  Substance Use Topics  . Alcohol use: Yes    Frequency: Never    Comment: rarely    Allergies  Allergen Reactions  . Clindamycin Hives    Current Outpatient Medications  Medication Sig Dispense Refill  . atorvastatin (LIPITOR) 40 MG tablet Take 40 mg by mouth daily.  11  . buPROPion (WELLBUTRIN XL) 150 MG 24 hr tablet Take 150 mg by mouth daily.     . hydrochlorothiazide (HYDRODIURIL) 25 MG tablet Take 25 mg by  mouth daily.     Boris Lown. Krill Oil 1000 MG CAPS Take 1,000 mg by mouth daily.    Marland Kitchen. labetalol (NORMODYNE) 200 MG tablet Take 400 mg by mouth at bedtime.     . magnesium oxide (MAG-OX) 400 MG tablet Take 400 mg by mouth daily.    . metFORMIN (GLUCOPHAGE-XR) 750 MG 24 hr tablet Take 1,500 mg by mouth at bedtime.     . Potassium 99 MG TABS Take 2 tablets by mouth daily.    . Prenatal Vit-Fe Fumarate-FA (PRENATAL MULTIVITAMIN) TABS tablet Take 1 tablet by mouth at bedtime.    Marland Kitchen. venlafaxine XR (EFFEXOR-XR) 37.5 MG 24 hr capsule Take 37.5 mg by mouth daily with breakfast.      No current facility-administered medications for this visit.     Review of Systems  Constitutional: Negative for chills, fatigue, fever and unexpected weight change.  HENT: Negative for trouble swallowing.  Eyes: Negative for loss of vision.  Respiratory: Negative for cough, shortness of breath and wheezing.  Cardiovascular: Negative for chest pain, leg swelling, palpitations and syncope.  GI: Negative for abdominal pain, blood in stool, diarrhea, nausea and vomiting.  GU: Negative for difficulty urinating, dysuria, frequency and hematuria.  Musculoskeletal: Negative for back pain, leg pain and joint pain.  Skin: Negative for rash.  Neurological: Negative for dizziness, headaches, light-headedness, numbness and seizures.  Psychiatric: Negative for behavioral problem, confusion, depressed mood and sleep disturbance.        Objective:  Objective   Vitals:   10/18/18 1554  BP: 120/80  Pulse: 87  Weight: 227 lb (103 kg)  Height: 5\' 5"  (1.651 m)   Body mass index is 37.77 kg/m.  Physical Exam Vitals signs and nursing note reviewed.  Constitutional:      Appearance: She is well-developed.  HENT:     Head: Normocephalic and atraumatic.  Eyes:     Pupils: Pupils are equal, round, and reactive to light.  Cardiovascular:     Rate and Rhythm: Normal rate and regular rhythm.  Pulmonary:     Effort: Pulmonary effort is  normal. No respiratory distress.  Skin:    General: Skin is warm and dry.  Neurological:     Mental Status: She is alert and oriented to person, place, and time.  Psychiatric:        Behavior: Behavior normal.        Thought Content: Thought content normal.        Judgment: Judgment normal.        Assessment/Plan:     32 yo with menorrhagia, abnormal uterine bleeding, thickened endometrium, desire for sterilization and right sided ovarian cyst. Risks of bleeding, infection, and damage to surrounding pelvic structures discussed with patient. She understands the surgical risks of this procedure and would like to proceed with  hysteroscopy, D&C, Novasure ablation, bilateral salpingectomy, and left ovarian cystectomy. CA125 ordered today.  More than 25 minutes were spent face to face with the patient in the room with more than 50% of the time spent providing counseling and discussing the plan of management.    Adelene Idlerhristanna Khya Halls MD [facility], [facility] 10/18/2018 4:54 PM

## 2018-10-18 NOTE — H&P (View-Only) (Signed)
Patient ID: Sandra Sanchez, female   DOB: 11/07/1985, 32 y.o.   MRN: [id number]  Reason for Consult: Pre-op Exam (Irrgular bleeding )   Referred by No ref. provider found  Subjective:     HPI:  Sandra Sanchez is a 32 y.o. female . She is being seen today for her preoperative visit. She has been considering if she would like a sterilization procedure and she has decided that she would like to have a bilateral salpingectomy. She read about how this procedure can help lower her lifetime risk of ovarian cancer and this appeals to her.   She has continued to have irregular heavy bleeding and is also now interested in a Novasure ablation. She has expressed multiple times that she does not wasn't to take hormonal contraceptives to manage her bleeding and a Nexplanon and an IUD do not appeal to her either. Her sister has had a Novasure ablation and she would like the same thing. She understands that after an uterine ablation she could no longer carry a pregnancy. She reports that her children are 639 and 14 and she doe snot want to "start over."  Past Medical History:  Diagnosis Date  . Anxiety   . Depression   . Obesity    Family History  Problem Relation Age of Onset  . Atrial fibrillation Mother   . Lung cancer Mother   . Heart attack Father        cardiac arrest   Past Surgical History:  Procedure Laterality Date  . MANDIBLE FRACTURE SURGERY      Short Social History:  Social History   Tobacco Use  . Smoking status: Former Games developermoker  . Smokeless tobacco: Never Used  Substance Use Topics  . Alcohol use: Yes    Frequency: Never    Comment: rarely    Allergies  Allergen Reactions  . Clindamycin Hives    Current Outpatient Medications  Medication Sig Dispense Refill  . atorvastatin (LIPITOR) 40 MG tablet Take 40 mg by mouth daily.  11  . buPROPion (WELLBUTRIN XL) 150 MG 24 hr tablet Take 150 mg by mouth daily.     . hydrochlorothiazide (HYDRODIURIL) 25 MG tablet Take 25 mg by  mouth daily.     Boris Lown. Krill Oil 1000 MG CAPS Take 1,000 mg by mouth daily.    Marland Kitchen. labetalol (NORMODYNE) 200 MG tablet Take 400 mg by mouth at bedtime.     . magnesium oxide (MAG-OX) 400 MG tablet Take 400 mg by mouth daily.    . metFORMIN (GLUCOPHAGE-XR) 750 MG 24 hr tablet Take 1,500 mg by mouth at bedtime.     . Potassium 99 MG TABS Take 2 tablets by mouth daily.    . Prenatal Vit-Fe Fumarate-FA (PRENATAL MULTIVITAMIN) TABS tablet Take 1 tablet by mouth at bedtime.    Marland Kitchen. venlafaxine XR (EFFEXOR-XR) 37.5 MG 24 hr capsule Take 37.5 mg by mouth daily with breakfast.      No current facility-administered medications for this visit.     Review of Systems  Constitutional: Negative for chills, fatigue, fever and unexpected weight change.  HENT: Negative for trouble swallowing.  Eyes: Negative for loss of vision.  Respiratory: Negative for cough, shortness of breath and wheezing.  Cardiovascular: Negative for chest pain, leg swelling, palpitations and syncope.  GI: Negative for abdominal pain, blood in stool, diarrhea, nausea and vomiting.  GU: Negative for difficulty urinating, dysuria, frequency and hematuria.  Musculoskeletal: Negative for back pain, leg pain and joint pain.  Skin: Negative for rash.  Neurological: Negative for dizziness, headaches, light-headedness, numbness and seizures.  Psychiatric: Negative for behavioral problem, confusion, depressed mood and sleep disturbance.        Objective:  Objective   Vitals:   10/18/18 1554  BP: 120/80  Pulse: 87  Weight: 227 lb (103 kg)  Height: 5' 5" (1.651 m)   Body mass index is 37.77 kg/m.  Physical Exam Vitals signs and nursing note reviewed.  Constitutional:      Appearance: She is well-developed.  HENT:     Head: Normocephalic and atraumatic.  Eyes:     Pupils: Pupils are equal, round, and reactive to light.  Cardiovascular:     Rate and Rhythm: Normal rate and regular rhythm.  Pulmonary:     Effort: Pulmonary effort is  normal. No respiratory distress.  Skin:    General: Skin is warm and dry.  Neurological:     Mental Status: She is alert and oriented to person, place, and time.  Psychiatric:        Behavior: Behavior normal.        Thought Content: Thought content normal.        Judgment: Judgment normal.        Assessment/Plan:     32 yo with menorrhagia, abnormal uterine bleeding, thickened endometrium, desire for sterilization and right sided ovarian cyst. Risks of bleeding, infection, and damage to surrounding pelvic structures discussed with patient. She understands the surgical risks of this procedure and would like to proceed with  hysteroscopy, D&C, Novasure ablation, bilateral salpingectomy, and left ovarian cystectomy. CA125 ordered today.  More than 25 minutes were spent face to face with the patient in the room with more than 50% of the time spent providing counseling and discussing the plan of management.    Christanna Schuman MD [facility], [facility] 10/18/2018 4:54 PM    

## 2018-10-19 LAB — CA 125: CANCER ANTIGEN (CA) 125: 44.1 U/mL — AB (ref 0.0–38.1)

## 2018-10-21 ENCOUNTER — Other Ambulatory Visit: Payer: Self-pay

## 2018-10-21 ENCOUNTER — Encounter
Admission: RE | Admit: 2018-10-21 | Discharge: 2018-10-21 | Disposition: A | Discharge status: Home / Self Care | Payer: BLUE CROSS/BLUE SHIELD | Source: Ambulatory Visit | Attending: Obstetrics and Gynecology | Admitting: Obstetrics and Gynecology

## 2018-10-21 HISTORY — DX: Essential (primary) hypertension: I10

## 2018-10-21 HISTORY — DX: Gastro-esophageal reflux disease without esophagitis: K21.9

## 2018-10-21 HISTORY — DX: Pure hypercholesterolemia, unspecified: E78.00

## 2018-10-21 HISTORY — DX: Anemia, unspecified: D64.9

## 2018-10-21 HISTORY — DX: Hypothyroidism, unspecified: E03.9

## 2018-10-21 NOTE — Patient Instructions (Addendum)
Your procedure is scheduled on: 10-26-18 TUESDAY Report to Same Day Surgery 2nd floor medical mall [facility] Entrance-take elevator on left to 2nd floor.  Check in with surgery information desk.) To find out your arrival time please call [phone] between 1PM - 3PM on 10-25-18 MONDAY  Remember: Instructions that are not followed completely may result in serious medical risk, up to and including death, or upon the discretion of your surgeon and anesthesiologist your surgery may need to be rescheduled.    _x___ 1. Do not eat food after midnight the night before your procedure. NO GUM OR CANDY AFTER MIDNIGHT.  You may drink clear liquids up to 2 hours before you are scheduled to arrive at the hospital for your procedure.  Do not drink clear liquids within 2 hours of your scheduled arrival to the hospital.  Clear liquids include  --Water or Apple juice without pulp  --Clear carbohydrate beverage such as ClearFast or Gatorade  --Black Coffee or Clear Tea (No milk, no creamers, do not add anything to the coffee or Tea   ____Ensure clear carbohydrate drink on the way to the hospital for bariatric patients  _X___Ensure clear carbohydrate drink 3 hours before surgery    __x__ 2. No Alcohol for 24 hours before or after surgery.   __x__3. No Smoking or e-cigarettes for 24 prior to surgery.  Do not use any chewable tobacco products for at least 6 hour prior to surgery   ____  4. Bring all medications with you on the day of surgery if instructed.    __x__ 5. Notify your doctor if there is any change in your medical condition     (cold, fever, infections).    x___6. On the morning of surgery brush your teeth with toothpaste and water.  You may rinse your mouth with mouth wash if you wish.  Do not swallow any toothpaste or mouthwash.   Do not wear jewelry, make-up, hairpins, clips or nail polish.  Do not wear lotions, powders, or perfumes. You may wear deodorant.  Do not shave 48 hours prior  to surgery. Men may shave face and neck.  Do not bring valuables to the hospital.    [facility] is not responsible for any belongings or valuables.               Contacts, dentures or bridgework may not be worn into surgery.  Leave your suitcase in the car. After surgery it may be brought to your room.  For patients admitted to the hospital, discharge time is determined by your treatment team.  _  Patients discharged the day of surgery will not be allowed to drive home.  You will need someone to drive you home and stay with you the night of your procedure.    Please read over the following fact sheets that you were given:   [facility] Preparing for Surgery  _x___ TAKE THE FOLLOWING MEDICATION THE MORNING OF SURGERY WITH A SMALL SIP OF WATER. These include:  1. WELLBUTRIN (BUPROPION)  2. EFFEXOR (VENLAFAXINE)  3. FAMOTIDINE (PEPCID)  4. TAKE A FAMOTIDINE THE NIGHT BEFORE YOUR SURGERY  5.  6.  ____Fleets enema or Magnesium Citrate as directed.   _x___ Use CHG Soap or sage wipes as directed on instruction sheet   ____ Use inhalers on the day of surgery and bring to hospital day of surgery  _X___ Stop Metformin 2 days prior to surgery-LAST DOSE ON Saturday, January 4TH.    ____ Take 1/2  of usual insulin dose the night before surgery and none on the morning surgery.   ____ Follow recommendations from Cardiologist, Pulmonologist or PCP regarding stopping Aspirin, Coumadin, Plavix ,Eliquis, Effient, or Pradaxa, and Pletal.  X____Stop Anti-inflammatories such as Advil, Aleve, Ibuprofen, Motrin, Naproxen, Naprosyn, Goodies powders or aspirin products NOW-OK to take Tylenol    _x___ Stop supplements until after surgery-PT HAS ALREADY STOPPED KRILL OIL   ____ Bring C-Pap to the hospital.

## 2018-10-22 ENCOUNTER — Encounter
Admission: RE | Admit: 2018-10-22 | Discharge: 2018-10-22 | Disposition: A | Discharge status: Home / Self Care | Payer: BLUE CROSS/BLUE SHIELD | Source: Ambulatory Visit | Attending: Obstetrics and Gynecology | Admitting: Obstetrics and Gynecology

## 2018-10-22 DIAGNOSIS — Z01818 Encounter for other preprocedural examination: Secondary | ICD-10-CM | POA: Diagnosis not present

## 2018-10-22 LAB — POTASSIUM: Potassium: 3.3 mmol/L — ABNORMAL LOW (ref 3.5–5.1)

## 2018-10-22 LAB — CBC
HCT: 33.9 % — ABNORMAL LOW (ref 36.0–46.0)
HEMOGLOBIN: 11.3 g/dL — AB (ref 12.0–15.0)
MCH: 31.5 pg (ref 26.0–34.0)
MCHC: 33.3 g/dL (ref 30.0–36.0)
MCV: 94.4 fL (ref 80.0–100.0)
NRBC: 0 % (ref 0.0–0.2)
Platelets: 233 10*3/uL (ref 150–400)
RBC: 3.59 MIL/uL — ABNORMAL LOW (ref 3.87–5.11)
RDW: 11.9 % (ref 11.5–15.5)
WBC: 5.9 10*3/uL (ref 4.0–10.5)

## 2018-10-22 LAB — TYPE AND SCREEN
ABO/RH(D): O POS
ANTIBODY SCREEN: NEGATIVE

## 2018-10-25 MED ORDER — SODIUM CHLORIDE 0.9 % IV SOLN
2.0000 g | INTRAVENOUS | Status: AC
  Administered 2018-10-26: 2 g via INTRAVENOUS
  Filled 2018-10-25: qty 2

## 2018-10-26 ENCOUNTER — Encounter
Admission: RE | Disposition: A | Discharge status: Home / Self Care | Payer: Self-pay | Source: Home / Self Care | Attending: Obstetrics and Gynecology

## 2018-10-26 ENCOUNTER — Ambulatory Visit
Admission: RE | Admit: 2018-10-26 | Discharge: 2018-10-26 | Disposition: A | Discharge status: Home / Self Care | Payer: BLUE CROSS/BLUE SHIELD | Attending: Obstetrics and Gynecology | Admitting: Obstetrics and Gynecology

## 2018-10-26 ENCOUNTER — Ambulatory Visit: Payer: BLUE CROSS/BLUE SHIELD | Admitting: Anesthesiology

## 2018-10-26 ENCOUNTER — Other Ambulatory Visit: Payer: Self-pay

## 2018-10-26 ENCOUNTER — Encounter: Payer: Self-pay | Admitting: Obstetrics and Gynecology

## 2018-10-26 DIAGNOSIS — Z87891 Personal history of nicotine dependence: Secondary | ICD-10-CM | POA: Diagnosis not present

## 2018-10-26 DIAGNOSIS — Z7984 Long term (current) use of oral hypoglycemic drugs: Secondary | ICD-10-CM | POA: Diagnosis not present

## 2018-10-26 DIAGNOSIS — Z302 Encounter for sterilization: Secondary | ICD-10-CM | POA: Diagnosis present

## 2018-10-26 DIAGNOSIS — F329 Major depressive disorder, single episode, unspecified: Secondary | ICD-10-CM | POA: Insufficient documentation

## 2018-10-26 DIAGNOSIS — N938 Other specified abnormal uterine and vaginal bleeding: Secondary | ICD-10-CM

## 2018-10-26 DIAGNOSIS — Z6837 Body mass index (BMI) 37.0-37.9, adult: Secondary | ICD-10-CM | POA: Insufficient documentation

## 2018-10-26 DIAGNOSIS — Z79899 Other long term (current) drug therapy: Secondary | ICD-10-CM | POA: Insufficient documentation

## 2018-10-26 DIAGNOSIS — N920 Excessive and frequent menstruation with regular cycle: Secondary | ICD-10-CM | POA: Diagnosis present

## 2018-10-26 DIAGNOSIS — N809 Endometriosis, unspecified: Secondary | ICD-10-CM | POA: Diagnosis not present

## 2018-10-26 DIAGNOSIS — N83201 Unspecified ovarian cyst, right side: Secondary | ICD-10-CM

## 2018-10-26 DIAGNOSIS — F419 Anxiety disorder, unspecified: Secondary | ICD-10-CM | POA: Insufficient documentation

## 2018-10-26 DIAGNOSIS — Q51810 Arcuate uterus: Secondary | ICD-10-CM | POA: Diagnosis not present

## 2018-10-26 DIAGNOSIS — E669 Obesity, unspecified: Secondary | ICD-10-CM | POA: Diagnosis not present

## 2018-10-26 HISTORY — PX: HYSTEROSCOPY WITH D & C: SHX1775

## 2018-10-26 HISTORY — PX: LAPAROSCOPIC BILATERAL SALPINGECTOMY: SHX5889

## 2018-10-26 LAB — POCT PREGNANCY, URINE: Preg Test, Ur: NEGATIVE

## 2018-10-26 LAB — POCT I-STAT 4, (NA,K, GLUC, HGB,HCT)
GLUCOSE: 69 mg/dL — AB (ref 70–99)
HCT: 33 % — ABNORMAL LOW (ref 36.0–46.0)
Hemoglobin: 11.2 g/dL — ABNORMAL LOW (ref 12.0–15.0)
Potassium: 3.9 mmol/L (ref 3.5–5.1)
Sodium: 140 mmol/L (ref 135–145)

## 2018-10-26 LAB — ABO/RH: ABO/RH(D): O POS

## 2018-10-26 SURGERY — DILATATION AND CURETTAGE /HYSTEROSCOPY
Anesthesia: General | Laterality: Right

## 2018-10-26 MED ORDER — FENTANYL CITRATE (PF) 100 MCG/2ML IJ SOLN
INTRAMUSCULAR | Status: DC | PRN
  Administered 2018-10-26 (×2): 50 ug via INTRAVENOUS

## 2018-10-26 MED ORDER — FENTANYL CITRATE (PF) 100 MCG/2ML IJ SOLN
25.0000 ug | INTRAMUSCULAR | Status: DC | PRN
  Administered 2018-10-26 (×2): 25 ug via INTRAVENOUS

## 2018-10-26 MED ORDER — ROCURONIUM BROMIDE 100 MG/10ML IV SOLN
INTRAVENOUS | Status: DC | PRN
  Administered 2018-10-26: 50 mg via INTRAVENOUS
  Administered 2018-10-26: 20 mg via INTRAVENOUS
  Administered 2018-10-26: 10 mg via INTRAVENOUS

## 2018-10-26 MED ORDER — PROMETHAZINE HCL 25 MG/ML IJ SOLN
INTRAMUSCULAR | Status: AC
  Filled 2018-10-26: qty 1

## 2018-10-26 MED ORDER — ROCURONIUM BROMIDE 50 MG/5ML IV SOLN
INTRAVENOUS | Status: AC
  Filled 2018-10-26: qty 1

## 2018-10-26 MED ORDER — LACTATED RINGERS IV SOLN
INTRAVENOUS | Status: DC
  Administered 2018-10-26: 12:00:00 via INTRAVENOUS

## 2018-10-26 MED ORDER — PROPOFOL 10 MG/ML IV BOLUS
INTRAVENOUS | Status: AC
  Filled 2018-10-26: qty 20

## 2018-10-26 MED ORDER — ACETAMINOPHEN NICU IV SYRINGE 10 MG/ML
INTRAVENOUS | Status: AC
  Filled 2018-10-26: qty 1

## 2018-10-26 MED ORDER — MIDAZOLAM HCL 2 MG/2ML IJ SOLN
INTRAMUSCULAR | Status: DC | PRN
  Administered 2018-10-26: 2 mg via INTRAVENOUS

## 2018-10-26 MED ORDER — ACETAMINOPHEN 500 MG PO TABS: 1000.0000 mg | ORAL_TABLET | Freq: Four times a day (QID) | ORAL | 0 refills | Status: AC | PRN

## 2018-10-26 MED ORDER — SEVOFLURANE IN SOLN
RESPIRATORY_TRACT | Status: AC
  Filled 2018-10-26: qty 250

## 2018-10-26 MED ORDER — HYDROCODONE-ACETAMINOPHEN 5-325 MG PO TABS: 1.0000 | ORAL_TABLET | Freq: Four times a day (QID) | ORAL | 0 refills | Status: AC | PRN

## 2018-10-26 MED ORDER — PROMETHAZINE HCL 25 MG/ML IJ SOLN
6.2500 mg | INTRAMUSCULAR | Status: DC | PRN
  Administered 2018-10-26: 6.25 mg via INTRAVENOUS

## 2018-10-26 MED ORDER — LIDOCAINE HCL (PF) 2 % IJ SOLN
INTRAMUSCULAR | Status: AC
  Filled 2018-10-26: qty 10

## 2018-10-26 MED ORDER — HYDROCODONE-ACETAMINOPHEN 5-325 MG PO TABS
1.0000 | ORAL_TABLET | Freq: Four times a day (QID) | ORAL | Status: DC | PRN
  Administered 2018-10-26: 1 via ORAL

## 2018-10-26 MED ORDER — PHENYLEPHRINE HCL 10 MG/ML IJ SOLN
INTRAMUSCULAR | Status: AC
  Filled 2018-10-26: qty 1

## 2018-10-26 MED ORDER — EPHEDRINE SULFATE 50 MG/ML IJ SOLN
INTRAMUSCULAR | Status: DC | PRN
  Administered 2018-10-26: 10 mg via INTRAVENOUS

## 2018-10-26 MED ORDER — DEXAMETHASONE SODIUM PHOSPHATE 10 MG/ML IJ SOLN
INTRAMUSCULAR | Status: DC | PRN
  Administered 2018-10-26: 10 mg via INTRAVENOUS

## 2018-10-26 MED ORDER — ONDANSETRON HCL 4 MG/2ML IJ SOLN
INTRAMUSCULAR | Status: DC | PRN
  Administered 2018-10-26: 4 mg via INTRAVENOUS

## 2018-10-26 MED ORDER — MIDAZOLAM HCL 2 MG/2ML IJ SOLN
INTRAMUSCULAR | Status: AC
  Filled 2018-10-26: qty 2

## 2018-10-26 MED ORDER — IBUPROFEN 600 MG PO TABS: 600.0000 mg | ORAL_TABLET | Freq: Four times a day (QID) | ORAL | 0 refills | Status: DC | PRN

## 2018-10-26 MED ORDER — PROPOFOL 10 MG/ML IV BOLUS
INTRAVENOUS | Status: DC | PRN
  Administered 2018-10-26: 200 mg via INTRAVENOUS

## 2018-10-26 MED ORDER — LIDOCAINE HCL (CARDIAC) PF 100 MG/5ML IV SOSY
PREFILLED_SYRINGE | INTRAVENOUS | Status: DC | PRN
  Administered 2018-10-26: 100 mg via INTRAVENOUS

## 2018-10-26 MED ORDER — BUPIVACAINE HCL (PF) 0.5 % IJ SOLN
INTRAMUSCULAR | Status: AC
  Filled 2018-10-26: qty 30

## 2018-10-26 MED ORDER — DEXAMETHASONE SODIUM PHOSPHATE 10 MG/ML IJ SOLN
INTRAMUSCULAR | Status: AC
  Filled 2018-10-26: qty 1

## 2018-10-26 MED ORDER — BUPIVACAINE HCL 0.5 % IJ SOLN
INTRAMUSCULAR | Status: DC | PRN
  Administered 2018-10-26: 15 mL

## 2018-10-26 MED ORDER — ONDANSETRON HCL 4 MG/2ML IJ SOLN
INTRAMUSCULAR | Status: AC
  Filled 2018-10-26: qty 2

## 2018-10-26 MED ORDER — SUGAMMADEX SODIUM 500 MG/5ML IV SOLN
INTRAVENOUS | Status: AC
  Filled 2018-10-26: qty 5

## 2018-10-26 MED ORDER — SODIUM CHLORIDE FLUSH 0.9 % IV SOLN
INTRAVENOUS | Status: AC
  Filled 2018-10-26: qty 10

## 2018-10-26 MED ORDER — HYDROCODONE-ACETAMINOPHEN 5-325 MG PO TABS
ORAL_TABLET | ORAL | Status: AC
  Administered 2018-10-26: 1 via ORAL
  Filled 2018-10-26: qty 1

## 2018-10-26 MED ORDER — PHENYLEPHRINE HCL 10 MG/ML IJ SOLN
INTRAMUSCULAR | Status: DC | PRN
  Administered 2018-10-26 (×5): 100 ug via INTRAVENOUS

## 2018-10-26 MED ORDER — ACETAMINOPHEN 10 MG/ML IV SOLN
INTRAVENOUS | Status: DC | PRN
  Administered 2018-10-26: 1000 mg via INTRAVENOUS

## 2018-10-26 MED ORDER — SUGAMMADEX SODIUM 500 MG/5ML IV SOLN
INTRAVENOUS | Status: DC | PRN
  Administered 2018-10-26: 210 mg via INTRAVENOUS

## 2018-10-26 MED ORDER — FENTANYL CITRATE (PF) 100 MCG/2ML IJ SOLN
INTRAMUSCULAR | Status: AC
  Filled 2018-10-26: qty 2

## 2018-10-26 MED ORDER — LACTATED RINGERS IV SOLN
INTRAVENOUS | Status: DC
  Administered 2018-10-26: 16:00:00 via INTRAVENOUS

## 2018-10-26 MED ORDER — FENTANYL CITRATE (PF) 100 MCG/2ML IJ SOLN
INTRAMUSCULAR | Status: AC
  Administered 2018-10-26: 25 ug via INTRAVENOUS
  Filled 2018-10-26: qty 2

## 2018-10-26 SURGICAL SUPPLY — 59 items
ABLATOR SURESOUND NOVASURE (ABLATOR) ×1 IMPLANT
ANCHOR TIS RET SYS 235ML (MISCELLANEOUS) ×3 IMPLANT
BAG COUNTER SPONGE EZ (MISCELLANEOUS) ×4 IMPLANT
BAG URINE DRAINAGE (UROLOGICAL SUPPLIES) ×4 IMPLANT
BLADE SURG SZ11 CARB STEEL (BLADE) ×4 IMPLANT
CANISTER SUCT 1200ML W/VALVE (MISCELLANEOUS) ×4 IMPLANT
CATH FOLEY 2WAY  5CC 16FR (CATHETERS) ×1
CATH ROBINSON RED A/P 16FR (CATHETERS) ×4 IMPLANT
CATH URTH 16FR FL 2W BLN LF (CATHETERS) ×3 IMPLANT
CHLORAPREP W/TINT 26ML (MISCELLANEOUS) ×4 IMPLANT
DEFOGGER SCOPE WARMER CLEARIFY (MISCELLANEOUS) ×1 IMPLANT
DERMABOND ADVANCED (GAUZE/BANDAGES/DRESSINGS) ×1
DERMABOND ADVANCED .7 DNX12 (GAUZE/BANDAGES/DRESSINGS) ×3 IMPLANT
DEVICE MYOSURE LITE (MISCELLANEOUS) IMPLANT
DEVICE MYOSURE REACH (MISCELLANEOUS) IMPLANT
DRAPE GENERAL ENDO 106X123.5 (DRAPES) ×4 IMPLANT
DRAPE LEGGINS SURG 28X43 STRL (DRAPES) ×4 IMPLANT
DRSG TEGADERM 2-3/8X2-3/4 SM (GAUZE/BANDAGES/DRESSINGS) ×12 IMPLANT
ELECT REM PT RETURN 9FT ADLT (ELECTROSURGICAL) ×4
ELECTRODE REM PT RTRN 9FT ADLT (ELECTROSURGICAL) ×3 IMPLANT
GLOVE BIO SURGEON STRL SZ 6.5 (GLOVE) ×7 IMPLANT
GLOVE BIOGEL PI IND STRL 6.5 (GLOVE) ×3 IMPLANT
GLOVE BIOGEL PI INDICATOR 6.5 (GLOVE) ×4
GLOVE SURG SYN 6.5 ES PF (GLOVE) ×4 IMPLANT
GLOVE SURG SYN 6.5 PF PI (GLOVE) ×3 IMPLANT
GOWN STRL REUS W/ TWL LRG LVL3 (GOWN DISPOSABLE) ×6 IMPLANT
GOWN STRL REUS W/TWL LRG LVL3 (GOWN DISPOSABLE) ×2
GRASPER SUT TROCAR 14GX15 (MISCELLANEOUS) ×4 IMPLANT
IRRIGATION STRYKERFLOW (MISCELLANEOUS) ×3 IMPLANT
IRRIGATOR STRYKERFLOW (MISCELLANEOUS) ×4
IV LACTATED RINGERS 1000ML (IV SOLUTION) ×4 IMPLANT
KIT PINK PAD W/HEAD ARE REST (MISCELLANEOUS) ×4
KIT PINK PAD W/HEAD ARM REST (MISCELLANEOUS) ×3 IMPLANT
KIT PROCEDURE FLUENT (KITS) ×3 IMPLANT
LABEL OR SOLS (LABEL) ×4 IMPLANT
LIGASURE VESSEL 5MM BLUNT TIP (ELECTROSURGICAL) IMPLANT
NS IRRIG 500ML POUR BTL (IV SOLUTION) ×4 IMPLANT
PACK DNC HYST (MISCELLANEOUS) ×3 IMPLANT
PACK GYN LAPAROSCOPIC (MISCELLANEOUS) ×4 IMPLANT
PACK LAP CHOLECYSTECTOMY (MISCELLANEOUS) ×4 IMPLANT
PAD OB MATERNITY 4.3X12.25 (PERSONAL CARE ITEMS) ×4 IMPLANT
PAD PREP 24X41 OB/GYN DISP (PERSONAL CARE ITEMS) ×4 IMPLANT
SCISSORS METZENBAUM CVD 33 (INSTRUMENTS) ×3 IMPLANT
SHEARS HARMONIC ACE PLUS 36CM (ENDOMECHANICALS) ×1 IMPLANT
SLEEVE ENDOPATH XCEL 5M (ENDOMECHANICALS) ×6 IMPLANT
SOL .9 NS 3000ML IRR  AL (IV SOLUTION) ×1
SOL .9 NS 3000ML IRR UROMATIC (IV SOLUTION) ×3 IMPLANT
SPONGE GAUZE 2X2 8PLY STRL LF (GAUZE/BANDAGES/DRESSINGS) ×8 IMPLANT
STRAP SAFETY 5IN WIDE (MISCELLANEOUS) ×4 IMPLANT
SUT VIC AB 2-0 UR6 27 (SUTURE) ×4 IMPLANT
SUT VIC AB 4-0 FS2 27 (SUTURE) ×1 IMPLANT
SUT VIC AB 4-0 PS2 18 (SUTURE) ×9 IMPLANT
SYR 10ML LL (SYRINGE) ×8 IMPLANT
TOWEL OR 17X26 4PK STRL BLUE (TOWEL DISPOSABLE) ×4 IMPLANT
TROCAR 5M 150ML BLDLS (TROCAR) ×2 IMPLANT
TROCAR ENDO BLADELESS 11MM (ENDOMECHANICALS) ×3 IMPLANT
TROCAR XCEL NON-BLD 5MMX100MML (ENDOMECHANICALS) ×4 IMPLANT
TUBING CONNECTING 10 (TUBING) ×4 IMPLANT
TUBING INSUFFLATION (TUBING) ×4 IMPLANT

## 2018-10-26 NOTE — Anesthesia Preprocedure Evaluation (Signed)
Anesthesia Evaluation  Patient identified by MRN, date of birth, ID band Patient awake    Reviewed: Allergy & Precautions, H&P , NPO status , Patient's Chart, lab work & pertinent test results, reviewed documented beta blocker date and time   History of Anesthesia Complications Negative for: history of anesthetic complications  Airway Mallampati: I  TM Distance: >3 FB Neck ROM: full    Dental  (+) Dental Advidsory Given, Missing, Teeth Intact, Caps   Pulmonary neg pulmonary ROS, former smoker,           Cardiovascular Exercise Tolerance: Good hypertension, (-) angina(-) CAD, (-) Past MI, (-) Cardiac Stents and (-) CABG + dysrhythmias (-) Valvular Problems/Murmurs     Neuro/Psych PSYCHIATRIC DISORDERS Anxiety Depression negative neurological ROS     GI/Hepatic Neg liver ROS, GERD  ,  Endo/Other  neg diabetesHypothyroidism   Renal/GU negative Renal ROS  negative genitourinary   Musculoskeletal   Abdominal   Peds  Hematology  (+) Blood dyscrasia, anemia ,   Anesthesia Other Findings Past Medical History: No date: Anemia No date: Anxiety No date: Depression No date: GERD (gastroesophageal reflux disease)     Comment:  RARE No date: High cholesterol No date: Hypertension No date: Hypothyroidism     Comment:  H/O WITH PREGNANCY ONLY No date: Obesity   Reproductive/Obstetrics negative OB ROS                             Anesthesia Physical Anesthesia Plan  ASA: III  Anesthesia Plan: General   Post-op Pain Management:    Induction: Intravenous  PONV Risk Score and Plan: 3 and Ondansetron, Dexamethasone, Midazolam, Promethazine and Treatment may vary due to age or medical condition  Airway Management Planned: Oral ETT  Additional Equipment:   Intra-op Plan:   Post-operative Plan: Extubation in OR  Informed Consent: I have reviewed the patients History and Physical, chart, labs  and discussed the procedure including the risks, benefits and alternatives for the proposed anesthesia with the patient or authorized representative who has indicated his/her understanding and acceptance.   Dental Advisory Given  Plan Discussed with: Anesthesiologist, CRNA and Surgeon  Anesthesia Plan Comments:         Anesthesia Quick Evaluation

## 2018-10-26 NOTE — Progress Notes (Signed)
Phenergan given for nausea.

## 2018-10-26 NOTE — Anesthesia Post-op Follow-up Note (Signed)
Anesthesia QCDR form completed.        

## 2018-10-26 NOTE — Anesthesia Procedure Notes (Addendum)
Procedure Name: Intubation Date/Time: 10/26/2018 12:45 PM Performed by: Aline Brochure, CRNA Pre-anesthesia Checklist: Patient identified, Emergency Drugs available, Suction available and Patient being monitored Patient Re-evaluated:Patient Re-evaluated prior to induction Oxygen Delivery Method: Circle system utilized Preoxygenation: Pre-oxygenation with 100% oxygen Induction Type: IV induction Ventilation: Mask ventilation without difficulty Laryngoscope Size: Mac and 3 Grade View: Grade I Tube type: Oral Tube size: 7.0 mm Number of attempts: 1 Airway Equipment and Method: Stylet Placement Confirmation: ETT inserted through vocal cords under direct vision,  breath sounds checked- equal and bilateral and positive ETCO2 Secured at: 21 cm Tube secured with: Tape Dental Injury: Teeth and Oropharynx as per pre-operative assessment

## 2018-10-26 NOTE — Progress Notes (Signed)
Nausea better  Eating crackers and drinking water

## 2018-10-26 NOTE — Interval H&P Note (Signed)
History and Physical Interval Note:  10/26/2018 12:18 PM  Sandra Sanchez  has presented today for surgery, with the diagnosis of Thickened endometrium, complex  The various methods of treatment have been discussed with the patient and family. After consideration of risks, benefits and other options for treatment, the patient has consented to  Procedure(s): DILATATION AND CURETTAGE /HYSTEROSCOPY, ENDOMETRIAL ABLATION WITH NOVASURE (N/A) LAPAROSCOPIC OVARIAN CYSTECTOMY (Right) LAPAROSCOPIC SALPINGECTOMY (Bilateral) as a surgical intervention .  The patient's history has been reviewed, patient examined, no change in status, stable for surgery.  I have reviewed the patient's chart and labs.  Questions were answered to the patient's satisfaction.     Judas Mohammad R Jupiter Kabir

## 2018-10-26 NOTE — Op Note (Signed)
Operative Note   10/26/2018  PRE-OP DIAGNOSIS: Desire for permanent sterilization, menorrhagia, abnormal uterine bleeding, right ovarian cyst.   POST-OP DIAGNOSIS: Desire for permanent sterilization, menorrhagia, abnormal uterine bleeding, endometriosis, arcuate uterus   PROCEDURE: Procedure(s): DILATATION AND CURETTAGE /HYSTEROSCOPY, ENDOMETRIAL ABLATION WITH NOVASURE LAPAROSCOPIC BILATERAL SALPINGECTOMY  SURGEON: Adelene Idlerhristanna  MD  ASSISTANT: Annamarie MajorPaul Harris, MD, FACOG  ANESTHESIA: Choice   ESTIMATED BLOOD LOSS: 25CC  COMPLICATIONS: None  DISPOSITION: PACU - hemodynamically stable.  CONDITION: stable  FINDINGS: Laparoscopic survey of the abdomen revealed a grossly normal uterus, tubes, ovaries, and liver edge, No intra-abdominal adhesions were noted.  Endometrial implants seen on the anterior abdominal wall, along the vesicouterine peritoneum, and in the bilateral ovarian fossa. The right ovary was adherent to the left side wall.   PROCEDURE IN DETAIL: The patient was taken to the OR where anesthesia was administed. The patient was positioned in dorsal lithotomy in the [place] stirrups. The patient was then examined under anesthesia with the above noted findings. The patient was prepped and draped in the normal sterile fashion and bladder was drained using a foley catheter. Speculum exam normal, and a sponge stick was placed for manipulation purposes.  Attention was turned to the patient's abdomen where a 5 mm skin incision was made in the umbilical fold, after injection of local anesthesia.  The 5 mm port was then placed under direct visualization with the operative laparoscope. Entry to the peritoneal cavity was confirmed and a pneumoperitoneum was obtained. She was positioned into Trendelenburg.   A 5 mm trocar was then placed in the left and right lower quadrant under direct visualization with the laparoscope.  Right and left fallopian tubes are identified and followed out to  their fimbria.  Each tube is excised utilizing the Harmonic scapel to include the fibria.  Small bleeding from the right ovary was coagulated with the hook cautery.   All instruments and ports were then removed from the abdomen after gas was expelled and patient was leveled.   The skin was closed with 4-0 monocryl and skin adhesive.  Attention was then turned to the peritoneum.   A weighted speculum was placed inside the patient's vagina, the cervix was adequately visualized, and the the anterior lip of the cervix was grasped with a single tooth tenaculum. The uterine cavity was sounded to 9 cm, and then the cervix was progressively dilated to 14 mm using Pratt dilators. The 0 degree hysteroscope was introduced through the cervix and advanced under direct visualization in the uterine cavity.  Normal saline was used as the distending fluid during the hysteroscopy.  This revealed the above noted intracavitary findings. 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 The cervical length was obtained by retracting the hysteroscope to the level of the internal cervical os and marking that point on the hysteroscopy.  This measurement yielded a value of 3 cm, yielding a total cavity length of 6 cm based on the previously obtained sounding measurement  The hystersocope was removed and the uterine cavity was curetted until a gritty texture was noted, the resulting endometrial curetting was sent to pathology.    The NovaSure device was then placed without difficulty. The Novasure array was deployed and seated, with a resulting uterine width of 4.3 cm. The NovaSure device passed the cavity assessment test.  Following this the device was activaed at a power of with a total ablation time of 34 seconds. Power 142. The NovaSure device was removed and repeat hysteroscopy reveals an appropriate lining  of the uterus and no perforation or injury. The  hysteroscope was removed with minimal discrepancy of fluid.   Tenaculum was  removed with excellent hemostasis noted. She was then taken out of dorsal lithotomy. Hemostasis noted.  The patient tolerated the procedure well. Sponge, lap and needle counts were correct times two. The patient was taken to recovery room in excellent condition.  Adelene Idler MD [facility], [facility] 10/26/2018 3:25 PM

## 2018-10-26 NOTE — Discharge Instructions (Signed)
AMBULATORY SURGERY  DISCHARGE INSTRUCTIONS   1) The drugs that you were given will stay in your system until tomorrow so for the next 24 hours you should not:  A) Drive an automobile B) Make any legal decisions C) Drink any alcoholic beverage   2) You may resume regular meals tomorrow.  Today it is better to start with liquids and gradually work up to solid foods.  You may eat anything you prefer, but it is better to start with liquids, then soup and crackers, and gradually work up to solid foods.   3) Please notify your doctor immediately if you have any unusual bleeding, trouble breathing, redness and pain at the surgery site, drainage, fever, or pain not relieved by medication. 4)   5) Your post-operative visit with Dr.                                     is: Date:                        Time:    Please call to schedule your post-operative visit.  6) Additional Instructions:      Laparoscopic Salpingectomy, Care After Refer to this sheet in the next few weeks. These instructions provide you with information about caring for yourself after your procedure. Your health care provider may also give you more specific instructions. Your treatment has been planned according to current medical practices, but problems sometimes occur. Call your health care provider if you have any problems or questions after your procedure. What can I expect after the procedure? After the procedure, it is common to have:  A sore throat.  Discomfort in your shoulder.  Mild discomfort or cramping in your abdomen.  Gas pains.  Pain or soreness in the area where the surgical cut (incision) was made.  A bloated feeling.  Tiredness.  Nausea.  Vomiting. Follow these instructions at home: Medicines  Take over-the-counter and prescription medicines only as told by your health care provider.  Do not take aspirin because it can cause bleeding.  Do not drive or operate heavy machinery while  taking prescription pain medicine. Activity  Rest for the rest of the day.  Return to your normal activities as told by your health care provider. Ask your health care provider what activities are safe for you. Incision care      Follow instructions from your health care provider about how to take care of your incision. Make sure you: ? Wash your hands with soap and water before you change your bandage (dressing). If soap and water are not available, use hand sanitizer. ? Change your dressing as told by your health care provider. ? Leave stitches (sutures) in place. They may need to stay in place for 2 weeks or longer.  Check your incision area every day for signs of infection. Check for: ? More redness, swelling, or pain. ? More fluid or blood. ? Warmth. ? Pus or a bad smell. Other Instructions  Do not take baths, swim, or use a hot tub until your health care provider approves. You may take showers.  Keep all follow-up visits as told by your health care provider. This is important.  Have someone help you with your daily household tasks for the first few days. Contact a health care provider if:  You have more redness, swelling, or pain around your incision.  Your incision feels warm to the touch.  You have pus or a bad smell coming from your incision.  The edges of your incision break open after the sutures have been removed.  Your pain does not improve after 2-3 days.  You have a rash.  You repeatedly become dizzy or light-headed.  Your pain medicine is not helping.  You are constipated. Get help right away if:  You have a fever.  You faint.  You have increasing pain in your abdomen.  You have severe pain in one or both of your shoulders.  You have fluid or blood coming from your sutures or from your vagina.  You have shortness of breath or difficulty breathing.  You have chest pain or leg pain.  You have ongoing nausea, vomiting, or diarrhea. This  information is not intended to replace advice given to you by your health care provider. Make sure you discuss any questions you have with your health care provider. Document Released: 04/25/2005 Document Revised: 06/02/2017 Document Reviewed: 09/16/2015 Elsevier Interactive Patient Education  2019 [employer] Inc.   Hysteroscopy, Care After This sheet gives you information about how to care for yourself after your procedure. Your health care provider may also give you more specific instructions. If you have problems or questions, contact your health care provider. What can I expect after the procedure? After the procedure, it is common to have:  Cramping.  Bleeding. This can vary from light spotting to menstrual-like bleeding. Follow these instructions at home: Activity  Rest for 1-2 days after the procedure.  Do not douche, use tampons, or have sex for 2 weeks after the procedure, or until your health care provider approves.  Do not drive for 24 hours after the procedure, or for as long as told by your health care provider.  Do not drive, use heavy machinery, or drink alcohol while taking prescription pain medicines. Medicines   Take over-the-counter and prescription medicines only as told by your health care provider.  Do not take aspirin during recovery. It can increase the risk of bleeding. General instructions  Do not take baths, swim, or use a hot tub until your health care provider approves. Take showers instead of baths for 2 weeks, or for as long as told by your health care provider.  To prevent or treat constipation while you are taking prescription pain medicine, your health care provider may recommend that you: ? Drink enough fluid to keep your urine clear or pale yellow. ? Take over-the-counter or prescription medicines. ? Eat foods that are high in fiber, such as fresh fruits and vegetables, whole grains, and beans. ? Limit foods that are high in fat and processed  sugars, such as fried and sweet foods.  Keep all follow-up visits as told by your health care provider. This is important. Contact a health care provider if:  You feel dizzy or lightheaded.  You feel nauseous.  You have abnormal vaginal discharge.  You have a rash.  You have pain that does not get better with medicine.  You have chills. Get help right away if:  You have bleeding that is heavier than a normal menstrual period.  You have a fever.  You have pain or cramps that get worse.  You develop new abdominal pain.  You faint.  You have pain in your shoulders.  You have shortness of breath. Summary  After the procedure, you may have cramping and some vaginal bleeding.  Do not douche, use tampons, or have sex  for 2 weeks after the procedure, or until your health care provider approves.  Do not take baths, swim, or use a hot tub until your health care provider approves. Take showers instead of baths for 2 weeks, or for as long as told by your health care provider.  Report any unusual symptoms to your health care provider.  Keep all follow-up visits as told by your health care provider. This is important. This information is not intended to replace advice given to you by your health care provider. Make sure you discuss any questions you have with your health care provider. Document Released: 07/27/2013 Document Revised: 11/04/2016 Document Reviewed: 11/04/2016 Elsevier Interactive Patient Education  2019 [employer]

## 2018-10-26 NOTE — Transfer of Care (Signed)
Immediate Anesthesia Transfer of Care Note  Patient: Sandra Sanchez  Procedure(s) Performed: DILATATION AND CURETTAGE /HYSTEROSCOPY, ENDOMETRIAL ABLATION WITH NOVASURE (N/A ) LAPAROSCOPIC OVARIAN CYSTECTOMY (Right ) LAPAROSCOPIC TUBAL LIGATION (Bilateral )  Patient Location: PACU  Anesthesia Type:General  Level of Consciousness: awake, alert  and oriented  Airway & Oxygen Therapy: Patient connected to face mask oxygen  Post-op Assessment: Post -op Vital signs reviewed and stable  Post vital signs: stable  Last Vitals:  Vitals Value Taken Time  BP 121/81 10/26/2018  2:56 PM  Temp 36.7 C 10/26/2018  2:56 PM  Pulse 95 10/26/2018  2:57 PM  Resp 24 10/26/2018  2:57 PM  SpO2 97 % 10/26/2018  2:57 PM  Vitals shown include unvalidated device data.  Last Pain:  Vitals:   10/26/18 1156  TempSrc: Temporal  PainSc: 0-No pain         Complications: No apparent anesthesia complications

## 2018-10-27 ENCOUNTER — Encounter: Payer: Self-pay | Admitting: Obstetrics and Gynecology

## 2018-10-28 LAB — SURGICAL PATHOLOGY

## 2018-10-28 NOTE — Progress Notes (Signed)
Discussed on phone, released to mychart.

## 2018-10-28 NOTE — Anesthesia Postprocedure Evaluation (Signed)
Anesthesia Post Note  Patient: Labreeska Finberg  Procedure(s) Performed: DILATATION AND CURETTAGE /HYSTEROSCOPY, ENDOMETRIAL ABLATION WITH NOVASURE (N/A ) LAPAROSCOPIC BILATERAL SALPINGECTOMY (Bilateral )  Patient location during evaluation: PACU Anesthesia Type: General Level of consciousness: awake and alert Pain management: pain level controlled Vital Signs Assessment: post-procedure vital signs reviewed and stable Respiratory status: spontaneous breathing, nonlabored ventilation, respiratory function stable and patient connected to nasal cannula oxygen Cardiovascular status: blood pressure returned to baseline and stable Postop Assessment: no apparent nausea or vomiting Anesthetic complications: no     Last Vitals:  Vitals:   10/26/18 1553 10/26/18 1618  BP: 121/73 122/72  Pulse: 76 79  Resp: 16   Temp: 36.7 C   SpO2: 98%     Last Pain:  Vitals:   10/27/18 0843  TempSrc:   PainSc: 0-No pain                 Lenard Simmer

## 2018-11-02 ENCOUNTER — Ambulatory Visit (INDEPENDENT_AMBULATORY_CARE_PROVIDER_SITE_OTHER): Payer: BLUE CROSS/BLUE SHIELD | Admitting: Obstetrics and Gynecology

## 2018-11-02 ENCOUNTER — Telehealth: Payer: Self-pay

## 2018-11-02 ENCOUNTER — Encounter: Payer: Self-pay | Admitting: Obstetrics and Gynecology

## 2018-11-02 VITALS — BP 112/68 | HR 85 | Ht 65.0 in | Wt 230.5 lb

## 2018-11-02 DIAGNOSIS — N809 Endometriosis, unspecified: Secondary | ICD-10-CM

## 2018-11-02 NOTE — Progress Notes (Signed)
  Postoperative Follow-up Patient presents post op from laparoscopy and salpingectomy and novasure ablation for menorrhagia and desire for sterilization, 1 week ago.  Subjective: Patient reports marked improvement in her preop symptoms. Eating a regular diet without difficulty. Pain is controlled without any medications.  Activity: normal activities of daily living. Patient reports additional symptom's since surgery of None.  Objective: BP 112/68   Pulse 85   Ht 5\' 5"  (1.651 m)   Wt 230 lb 8 oz (104.6 kg)   LMP 10/13/2018 (Exact Date)   BMI 38.36 kg/m  Physical Exam Constitutional:      Appearance: She is well-developed.  Genitourinary:     Vagina and uterus normal.     No lesions in the vagina.     No cervical motion tenderness.     No right or left adnexal mass present.  HENT:     Head: Normocephalic and atraumatic.  Neck:     Musculoskeletal: Neck supple.     Thyroid: No thyromegaly.  Cardiovascular:     Rate and Rhythm: Normal rate and regular rhythm.     Heart sounds: Normal heart sounds.  Pulmonary:     Effort: Pulmonary effort is normal.     Breath sounds: Normal breath sounds.  Chest:     Breasts:        Right: No inverted nipple, mass, nipple discharge or skin change.        Left: No inverted nipple, mass, nipple discharge or skin change.  Abdominal:     General: Bowel sounds are normal. There is no distension.     Palpations: Abdomen is soft. There is no mass.     Comments: Abdominal incisions healing well.   Neurological:     Mental Status: She is alert and oriented to person, place, and time.  Skin:    General: Skin is warm and dry.  Psychiatric:        Behavior: Behavior normal.        Thought Content: Thought content normal.        Judgment: Judgment normal.  Vitals signs reviewed.     Assessment: s/p :  Laparoscopy, salpingectomy, novasure ablation, hysteroscopy D&C stable  Plan: Patient has done well after surgery with no apparent  complications.  I have discussed the post-operative course to date, and the expected progress moving forward.  The patient understands what complications to be concerned about.  I will see the patient in routine follow up, or sooner if needed.    Doing well no complaints Discussed endometriosis and treatment options.  Declines Lupron or Orlissa at this time Discussed Laser ablation of lesions as an option, will consider for the future Will need pap in November.  Follow up in 4 weeks.  Activity plan: May return to work in 1 days with restrictions.  Pelvic rest.  Breyer Tejera R Danijah Noh 11/02/2018, 9:57 AM

## 2018-11-02 NOTE — Telephone Encounter (Signed)
Please advise 

## 2018-11-02 NOTE — Telephone Encounter (Signed)
Pt was seen this am; forgot to ask - when is it okay to have sex?  [phone]

## 2018-11-02 NOTE — Telephone Encounter (Signed)
She can have sex 2 weeks after her surgery.

## 2018-11-02 NOTE — Telephone Encounter (Signed)
Called pt to inform her about the intercourse, pt also had a question about when she could be able to take a bath, I have spoken with Dr.Schuman and she stated she can start taking baths at the beginning of next week.

## 2018-11-29 ENCOUNTER — Encounter: Payer: Self-pay | Admitting: Obstetrics and Gynecology

## 2018-11-29 ENCOUNTER — Ambulatory Visit (INDEPENDENT_AMBULATORY_CARE_PROVIDER_SITE_OTHER): Payer: BLUE CROSS/BLUE SHIELD | Admitting: Obstetrics and Gynecology

## 2018-11-29 VITALS — BP 118/74 | Ht 65.0 in | Wt 234.0 lb

## 2018-11-29 DIAGNOSIS — N809 Endometriosis, unspecified: Secondary | ICD-10-CM

## 2018-11-29 DIAGNOSIS — Z9889 Other specified postprocedural states: Secondary | ICD-10-CM

## 2018-11-29 DIAGNOSIS — N939 Abnormal uterine and vaginal bleeding, unspecified: Secondary | ICD-10-CM

## 2018-11-29 DIAGNOSIS — N921 Excessive and frequent menstruation with irregular cycle: Secondary | ICD-10-CM

## 2018-11-29 NOTE — Progress Notes (Signed)
  Postoperative Follow-up Patient presents post op from laparoscopic bilateral salpingectomy and novasure ablation for abnormal uterine bleeding, pelvic pain and requested sterilization, 6 weeks ago.  Subjective: Patient reports some improvement in her preop symptoms. Eating a regular diet with difficulty. Pain is controlled without any medications.  Activity: normal activities of daily living. Patient reports additional symptom's since surgery of None.  She had 4 days of bleeding on 11/13/2018 - 1/ 29/2020. Two days were heavy. Two days were normal.   Also had 2 days of yellow discharge, but that has resolved. No pain. No odor. No abnormal bleeding.   She also has had some continued right sided pain which she now knows is form the right sided ovarian endometrioma.   Objective: BP 118/74   Ht 5\' 5"  (1.651 m)   Wt 234 lb (106.1 kg)   BMI 38.94 kg/m  Physical Exam Constitutional:      Appearance: She is well-developed.  Genitourinary:     Vagina and uterus normal.     No lesions in the vagina.     No cervical motion tenderness.     No right or left adnexal mass present.  HENT:     Head: Normocephalic and atraumatic.  Neck:     Musculoskeletal: Neck supple.     Thyroid: No thyromegaly.  Cardiovascular:     Rate and Rhythm: Normal rate and regular rhythm.     Heart sounds: Normal heart sounds.  Pulmonary:     Effort: Pulmonary effort is normal.     Breath sounds: Normal breath sounds.  Chest:     Breasts:        Right: No inverted nipple, mass, nipple discharge or skin change.        Left: No inverted nipple, mass, nipple discharge or skin change.  Abdominal:     General: Bowel sounds are normal. There is no distension.     Palpations: Abdomen is soft. There is no mass.     Comments: Incisions healing well  Neurological:     Mental Status: She is alert and oriented to person, place, and time.  Skin:    General: Skin is warm and dry.  Psychiatric:        Behavior: Behavior  normal.        Thought Content: Thought content normal.        Judgment: Judgment normal.  Vitals signs reviewed.    Assessment: s/p :  laparoscopic bilateral salpingectomy and novasure ablation stable  Plan: Patient has done well after surgery with no apparent complications.  I have discussed the post-operative course to date, and the expected progress moving forward.  The patient understands what complications to be concerned about.  I will see the patient in routine follow up, or sooner if needed.    Activity plan: No restriction.  Follow up in 3 months to access for bleeding pattern.    R  11/29/2018, 5:16 PM

## 2019-03-07 ENCOUNTER — Other Ambulatory Visit: Payer: Self-pay

## 2019-03-07 ENCOUNTER — Emergency Department: Payer: BLUE CROSS/BLUE SHIELD

## 2019-03-07 ENCOUNTER — Emergency Department
Admission: EM | Admit: 2019-03-07 | Discharge: 2019-03-07 | Disposition: A | Discharge status: Home / Self Care | Payer: BLUE CROSS/BLUE SHIELD | Attending: Emergency Medicine | Admitting: Emergency Medicine

## 2019-03-07 DIAGNOSIS — D649 Anemia, unspecified: Secondary | ICD-10-CM | POA: Insufficient documentation

## 2019-03-07 DIAGNOSIS — Z87891 Personal history of nicotine dependence: Secondary | ICD-10-CM | POA: Insufficient documentation

## 2019-03-07 DIAGNOSIS — Z20828 Contact with and (suspected) exposure to other viral communicable diseases: Secondary | ICD-10-CM | POA: Diagnosis not present

## 2019-03-07 DIAGNOSIS — I10 Essential (primary) hypertension: Secondary | ICD-10-CM | POA: Diagnosis not present

## 2019-03-07 DIAGNOSIS — J069 Acute upper respiratory infection, unspecified: Secondary | ICD-10-CM | POA: Diagnosis not present

## 2019-03-07 DIAGNOSIS — R0602 Shortness of breath: Secondary | ICD-10-CM | POA: Diagnosis present

## 2019-03-07 LAB — SARS CORONAVIRUS 2 BY RT PCR (HOSPITAL ORDER, PERFORMED IN ~~LOC~~ HOSPITAL LAB): SARS Coronavirus 2: NEGATIVE

## 2019-03-07 NOTE — ED Provider Notes (Signed)
[facility] Emergency Department Provider Note  Time seen: 7:17 AM  I have reviewed the triage vital signs and the nursing notes.   HISTORY  Chief Complaint Fever; Cough; and Sore Throat   HPI Sandra Sanchez is a 33 y.o. female with a past medical history of anemia, anxiety, gastric reflux, hypertension, hyperlipidemia presents to the emergency department for shortness of breath and fever.  Patient works at [employer] where there is currently a corona virus outbreak.  For the past 3 days she has had a very slight cough, has had fever to 102 at home, a burning sensation at times in her chest.  States she has had nausea and intermittent diarrhea.    Past Medical History:  Diagnosis Date  . Anemia   . Anxiety   . Depression   . GERD (gastroesophageal reflux disease)    RARE  . High cholesterol   . Hypertension   . Hypothyroidism    H/O WITH PREGNANCY ONLY  . Obesity     Patient Active Problem List   Diagnosis Date Noted  . Endometriosis determined by laparoscopy 11/02/2018    Past Surgical History:  Procedure Laterality Date  . HYSTEROSCOPY W/D&C N/A 10/26/2018   Procedure: DILATATION AND CURETTAGE /HYSTEROSCOPY, ENDOMETRIAL ABLATION WITH NOVASURE;  Surgeon: Natale Milch, MD;  Location: [facility] ORS;  Service: Gynecology;  Laterality: N/A;  . LAPAROSCOPIC BILATERAL SALPINGECTOMY Bilateral 10/26/2018   Procedure: LAPAROSCOPIC BILATERAL SALPINGECTOMY;  Surgeon: Natale Milch, MD;  Location: [facility] ORS;  Service: Gynecology;  Laterality: Bilateral;  . MANDIBLE FRACTURE SURGERY      Prior to Admission medications   Medication Sig Start Date End Date Taking? Authorizing Provider  acetaminophen (TYLENOL) 500 MG tablet Take 2 tablets (1,000 mg total) by mouth every 6 (six) hours as needed. Patient not taking: Reported on 11/02/2018 10/26/18 10/26/19  Natale Milch, MD  atorvastatin (LIPITOR) 40 MG tablet Take 40 mg by mouth at bedtime.   09/03/18   [provider]  buPROPion (WELLBUTRIN XL) 150 MG 24 hr tablet Take 150 mg by mouth every morning.  06/16/18   [provider]  Cholecalciferol (VITAMIN D) 50 MCG (2000 UT) tablet Take 2,000 Units by mouth at bedtime.    [provider]  famotidine (PEPCID) 10 MG tablet Take 10 mg by mouth as needed for heartburn or indigestion.    [provider]  hydrochlorothiazide (HYDRODIURIL) 25 MG tablet Take 25 mg by mouth daily.  07/23/17   [provider]  ibuprofen (ADVIL,MOTRIN) 600 MG tablet Take 1 tablet (600 mg total) by mouth every 6 (six) hours as needed. 10/26/18   Schuman, Jaquelyn Bitter, MD  Krill Oil 1000 MG CAPS Take 1,000 mg by mouth daily.    [provider]  labetalol (NORMODYNE) 200 MG tablet Take 400 mg by mouth at bedtime.  03/30/17   [provider]  magnesium oxide (MAG-OX) 400 MG tablet Take 400 mg by mouth at bedtime.     [provider]  metFORMIN (GLUCOPHAGE-XR) 750 MG 24 hr tablet Take 1,500 mg by mouth at bedtime.  03/01/18   [provider]  Potassium 99 MG TABS Take 2 tablets by mouth daily.    [provider]  Prenatal Vit-Fe Fumarate-FA (PRENATAL MULTIVITAMIN) TABS tablet Take 1 tablet by mouth at bedtime.    [provider]  venlafaxine XR (EFFEXOR-XR) 37.5 MG 24 hr capsule Take 37.5 mg by mouth daily with breakfast.  07/14/18   [provider]    Allergies  Allergen Reactions  . Clindamycin Hives    Family History  Problem Relation Age of Onset  . Atrial fibrillation Mother   . Lung cancer Mother   . Heart attack Father        cardiac arrest    Social History Social History   Tobacco Use  . Smoking status: Former Smoker    Packs/day: 1.00    Years: 15.00    Pack years: 15.00    Types: Cigarettes    Last attempt to quit: 02/18/2018    Years since quitting: 1.0  . Smokeless tobacco: Never Used  Substance Use Topics  . Alcohol use: Yes     Frequency: Never    Comment: rarely  . Drug use: No    Review of Systems Constitutional: Positive for fever ENT: Negative for recent illness/congestion Cardiovascular: Negative for chest pain. Respiratory: Mild shortness of breath, minimal cough Gastrointestinal: Negative for abdominal pain.  Mild nausea.  Positive for diarrhea Musculoskeletal: Negative for musculoskeletal complaints Skin: Negative for skin complaints  Neurological: Negative for headache All other ROS negative  ____________________________________________   PHYSICAL EXAM:  VITAL SIGNS: ED Triage Vitals [03/07/19 0648]  Enc Vitals Group     BP (!) 108/94     Pulse Rate 92     Resp 16     Temp 98.8 F (37.1 C)     Temp Source Oral     SpO2 100 %     Weight 240 lb (108.9 kg)     Height 5\' 5"  (1.651 m)     Head Circumference      Peak Flow      Pain Score 2     Pain Loc      Pain Edu?      Excl. in GC?    Constitutional: Alert and oriented. Well appearing and in no distress. Eyes: Normal exam ENT      Head: Normocephalic and atraumatic.      Mouth/Throat: Mucous membranes are moist. Cardiovascular: Normal rate, regular rhythm. Respiratory: Normal respiratory effort without tachypnea nor retractions. Breath sounds are clear Gastrointestinal: Soft and nontender. No distention.   Musculoskeletal: Nontender with normal range of motion in all extremities.  Neurologic:  Normal speech and language. No gross focal neurologic deficits  Skin:  Skin is warm, dry and intact.  Psychiatric: Mood and affect are normal.   ____________________________________________   RADIOLOGY  Chest x-ray is clear  ____________________________________________   INITIAL IMPRESSION / ASSESSMENT AND PLAN / ED COURSE  Pertinent labs & imaging results that were available during my care of the patient were reviewed by me and considered in my medical decision making (see chart for details).   Patient presents emergency  department for mild shortness of breath cough nausea along with fever at home for the past 2 to 3 days.  Patient states she works at [employer] where there is currently a corona virus outbreak.  Patient is afebrile in the emergency department.  Clear lung sounds, normal heart sounds.  Overall very well appearing with a reassuring physical exam.  However given the patient's high risk job we will test for coronavirus obtain a chest x-ray and continue to closely monitor.  Patient agreeable to plan of care.  Chest x-ray is clear.  Corona test is negative.  We will discharge patient with supportive care.  I did discuss quarantine/isolation precautions at home as well.  Sandra Sanchez was evaluated in Emergency Department on  03/07/2019 for the symptoms described in the history of present illness. She was evaluated in the context of the global COVID-19 pandemic, which necessitated consideration that the patient might be at risk for infection with the SARS-CoV-2 virus that causes COVID-19. Institutional protocols and algorithms that pertain to the evaluation of patients at risk for COVID-19 are in a state of rapid change based on information released by regulatory bodies including the CDC and federal and state organizations. These policies and algorithms were followed during the patient's care in the ED.  ____________________________________________   FINAL CLINICAL IMPRESSION(S) / ED DIAGNOSES  Fever Upper respiratory infection   Minna Antis, MD 03/07/19 [phone]

## 2019-03-07 NOTE — ED Triage Notes (Signed)
Pt states works at [employer] where and outbreak of COVID 19 is occurring. Pt states she has a fever, sore throat, cough, "easily winded". Pt states symptoms began two days. Pta.

## 2019-03-07 NOTE — ED Notes (Signed)
Pt speaking with this RN, no SOB noted, A&O x 4.

## 2020-04-24 ENCOUNTER — Other Ambulatory Visit: Payer: Self-pay | Admitting: Obstetrics and Gynecology

## 2020-06-18 ENCOUNTER — Other Ambulatory Visit: Payer: Self-pay | Admitting: Obstetrics and Gynecology

## 2020-07-10 ENCOUNTER — Encounter
Admission: RE | Admit: 2020-07-10 | Discharge: 2020-07-10 | Disposition: A | Discharge status: Home / Self Care | Payer: BC Managed Care – PPO | Source: Ambulatory Visit | Attending: Obstetrics and Gynecology | Admitting: Obstetrics and Gynecology

## 2020-07-10 ENCOUNTER — Other Ambulatory Visit: Payer: Self-pay

## 2020-07-10 NOTE — Patient Instructions (Signed)
Your procedure is scheduled on: Jul 20, 2020 Friday Report to Day Surgery on the 2nd floor of [facility]. To find out your arrival time, please call [phone] between 1PM - 3PM on:July 19, 2020 THURSDAY  REMEMBER: Instructions that are not followed completely may result in serious medical risk, up to and including death; or upon the discretion of your surgeon and anesthesiologist your surgery may need to be rescheduled.  Do not eat food after midnight the night before surgery.  No gum chewing, lozengers or hard candies.  You may however, drink CLEAR liquids up to 2 hours before you are scheduled to arrive for your surgery. Do not drink anything within 2 hours of your scheduled arrival time.  Clear liquids include: - water  - apple juice without pulp - gatorade (not RED) - black coffee or tea (Do NOT add milk or creamers to the coffee or tea) Do NOT drink anything that is not on this list.  Type 1 and Type 2 diabetics should only drink water.  In addition, if your doctor has ordered for you to drink the provided  Ensure Pre-Surgery Clear Carbohydrate Drink  Drinking this carbohydrate drink up to two hours before surgery helps to reduce insulin resistance and improve patient outcomes. Please complete drinking 2 hours prior to scheduled arrival time.  TAKE THESE MEDICATIONS THE MORNING OF SURGERY WITH A SIP OF WATER: Bupropion effexor  topamax famotidine (take one the night before and one on the morning of surgery - helps to prevent nausea after surgery.)   Stop Metformin 2 days prior to surgery. Last dose 07/17/2020  One week prior to surgery: Stop Anti-inflammatories (NSAIDS) such as Advil, Aleve, Ibuprofen, Motrin, Naproxen, Naprosyn and aspirin or Aspirin based products such as Excedrin, Goodys Powder, BC Powder. STOP KRILL OIL Stop ANY OVER THE COUNTER supplements until after surgery. (You may continue taking Tylenol, Vitamin D, Vitamin B, and  multivitamin.)  No Alcohol for 24 hours before or after surgery.  No Smoking including e-cigarettes for 24 hours prior to surgery.  No chewable tobacco products for at least 6 hours prior to surgery.  No nicotine patches on the day of surgery.  Do not use any "recreational" drugs for at least a week prior to your surgery.  Please be advised that the combination of cocaine and anesthesia may have negative outcomes, up to and including death. If you test positive for cocaine, your surgery will be cancelled.  On the morning of surgery brush your teeth with toothpaste and water, you may rinse your mouth with mouthwash if you wish. Do not swallow any toothpaste or mouthwash.  Do not wear jewelry, make-up, hairpins, clips or nail polish.  Do not wear lotions, powders, or perfumes.   Do not shave 48 hours prior to surgery.   Contact lenses, hearing aids and dentures may not be worn into surgery.  Do not bring valuables to the hospital. [facility] is not responsible for any missing/lost belongings or valuables.   Use CHG Soap as directed on instruction sheet.  Notify your doctor if there is any change in your medical condition (cold, fever, infection).  Wear comfortable clothing (specific to your surgery type) to the hospital.  Plan for stool softeners for home use; pain medications have a tendency to cause constipation. You can also help prevent constipation by eating foods high in fiber such as fruits and vegetables and drinking plenty of fluids as your diet allows.  After surgery, you can help prevent  lung complications by doing breathing exercises.  Take deep breaths and cough every 1-2 hours. Your doctor may order a device called an Incentive Spirometer to help you take deep breaths. When coughing or sneezing, hold a pillow firmly against your incision with both hands. This is called "splinting." Doing this helps protect your incision. It also decreases belly discomfort.  If you  are being discharged the day of surgery, you will not be allowed to drive home. You will need a responsible adult (18 years or older) to drive you home and stay with you that night.   If you are taking public transportation, you will need to have a responsible adult (18 years or older) with you. Please confirm with your physician that it is acceptable to use public transportation.   Please call the Pre-admissions Testing Dept. at [phone] if you have any questions about these instructions.  Visitation Policy:  Patients undergoing a surgery or procedure may have one family member or support person with them as long as that person is not COVID-19 positive or experiencing its symptoms.  That person may remain in the waiting area during the procedure.  Inpatient Visitation Update:   In an effort to ensure the safety of our team members and our patients, we are implementing a change to our visitation policy:  Effective Monday, Aug. 9, at 7 a.m., inpatients will be allowed one support person.  o The support person may change daily.  o The support person must pass our screening, gel in and out, and wear a mask at all times, including in the patient's room.  o Patients must also wear a mask when staff or their support person are in the room.  o Masking is required regardless of vaccination status.  Systemwide, no visitors 17 or younger.

## 2020-07-11 NOTE — H&P (Signed)
[name] is a 34 y.o. female here for LAVH  And possible right oophorectomy   Bleeding since 01/30/20 and Vaginitis The patient with  continued bleeding since 01/30/2020  Pt with a h/o endometriosis and CPP . She is s/p endometrial ablation, and bilateral salpingectomy   10/2018 ( Dr Darryl Nestle). Bleeding has now worsened since ablation ( bleeding was normal 3-5 day post , now bleeding has increased )  Pt was offered tx for endometriosis post her abaltion , L/S 14 months ago and she opted for no tx.   Her pelvic pain is not her overriding concern . She states that pain occurs R>L about 1 week before cycle      Past Medical History:  has a past medical history of Abnormal cytology (2014), Depression (2010), Endometriosis (2020), Hyperlipidemia (2016), Hypertensive disorder (03/05/2019), Migraine headache (01/04/2015), Morbid obesity (CMS-HCC) (01/04/2015), and Vitamin D insufficiency (01/04/2015).  Past Surgical History:  has a past surgical history that includes reconstruction of jaw; Tubal ligation (10/2018); and Endometrial ablation (10/2018). Family History: family history includes Alcohol abuse in her father; Anxiety in her sister; Breast cancer in her paternal grandmother; Cancer (age of onset: 25) in her mother; Coronary Artery Disease (Blocked arteries around heart) in her father and mother; Diabetes in her maternal grandmother and paternal grandmother; High blood pressure (Hypertension) in her mother; Hyperlipidemia (Elevated cholesterol) in her sister; Myocardial Infarction (Heart attack) in her father, maternal grandfather, and mother; Stroke in her maternal grandmother and paternal grandmother; Throat cancer in her maternal aunt; Thyroid disease in her paternal aunt. Social History:  reports that she quit smoking about 7 years ago. Her smoking use included cigarettes. She has a 2.50 pack-year smoking history. She has never used smokeless tobacco. She reports current alcohol use. She reports that  she does not use drugs. OB/GYN History:          OB History    Gravida  2   Para  2   Term      Preterm      AB      Living  2     SAB      TAB      Ectopic      Molar      Multiple      Live Births  2          Allergies: is allergic to clindamycin. Medications:  Current Outpatient Medications:  .  atorvastatin (LIPITOR) 40 MG tablet, Take 1 tablet (40 mg total) by mouth once daily, Disp: 30 tablet, Rfl: 0 .  buPROPion (WELLBUTRIN XL) 150 MG XL tablet, Take 1 tablet (150 mg total) by mouth once daily, Disp: 30 tablet, Rfl: 11 .  cholecalciferol (VITAMIN D3) 2,000 unit capsule, Take 2,000 Units by mouth once daily, Disp: , Rfl:  .  famotidine (PEPCID) 40 MG tablet, Take 1 tablet (40 mg total) by mouth once daily Take 30 minutes before dinner., Disp: 90 tablet, Rfl: 1 .  hydroCHLOROthiazide (HYDRODIURIL) 25 MG tablet, Take 1 tablet (25 mg total) by mouth once daily, Disp: 30 tablet, Rfl: 11 .  krill-om3-dha-epa-om6-lip-astx (KRILL OIL, OMEGA 3 AND 6,) 1000-130(40-80) mg Cap, Take by mouth, Disp: , Rfl:  .  labetaloL (TRANDATE) 200 MG tablet, Take 2 tablets (400 mg total) by mouth 2 (two) times daily (Patient taking differently: Take 400 mg by mouth once daily   ), Disp: 120 tablet, Rfl: 11 .  magnesium oxide (MAG-OX) 400 mg (241.3 mg magnesium) tablet, Take by  mouth, Disp: , Rfl:  .  metFORMIN (GLUCOPHAGE-XR) 750 MG XR tablet, Take 2 tablets (1,500 mg total) by mouth daily with dinner, Disp: 180 tablet, Rfl: 0 .  naproxen sodium (ANAPROX DS) 550 MG tablet, Take 1 tablet (550 mg total) by mouth 2 (two) times daily with meals, Disp: 60 tablet, Rfl: 11 .  omeprazole (PRILOSEC) 40 MG DR capsule, Take 1 capsule (40 mg total) by mouth once daily, Disp: 90 capsule, Rfl: 1 .  phentermine (ADIPEX-P) 37.5 mg tablet, Take 1 tablet (37.5 mg total) by mouth every morning before breakfast for 30 days, Disp: 30 tablet, Rfl: 0 .  potassium gluconate 2.5 mEq Tab, Take by mouth,  Disp: , Rfl:  .  prenatal vit no.124-iron-folic (PRENATAL VITAMIN) 27 mg iron- 800 mcg Tab, Take by mouth, Disp: , Rfl:  .  PREVIDENT 5000 SENSITIVE 1.1-5 %, PLEASE SEE ATTACHED FOR DETAILED DIRECTIONS, Disp: , Rfl: 1 .  topiramate (TOPAMAX) 50 MG tablet, Take 1 tablet (50 mg total) by mouth 2 (two) times daily, Disp: 60 tablet, Rfl: 0 .  venlafaxine (EFFEXOR-XR) 37.5 MG XR capsule, Take 2 capsules (75 mg total) by mouth once daily, Disp: 60 capsule, Rfl: 0 .  ASHWAGANDHA ROOT EXTRACT,BULK, MISC, , Disp: , Rfl:   Review of Systems: General:                      No fatigue or weight loss Eyes:                           No vision changes Ears:                            No hearing difficulty Respiratory:                No cough or shortness of breath Pulmonary:                  No asthma or shortness of breath Cardiovascular:           No chest pain, palpitations, dyspnea on exertion Gastrointestinal:          No abdominal bloating, chronic diarrhea, constipations, masses, pain or hematochezia Genitourinary:             No hematuria, dysuria, abnormal vaginal discharge, pelvic pain, Menometrorrhagia Lymphatic:                   No swollen lymph nodes Musculoskeletal:         No muscle weakness Neurologic:                  No extremity weakness, syncope, seizure disorder Psychiatric:                  No history of depression, delusions or suicidal/homicidal ideation    Exam:      Vitals:   07/12/20    BP: 117/84  Pulse: 87    Body mass index is 40.27 kg/m.  WDWN white/  female in NAD   Lungs: CTA  CV : RRR without murmur    Neck:  no thyromegaly Abdomen: soft , no mass, normal active bowel sounds,  non-tender, no rebound tenderness Pelvic: tanner stage 5 ,  External genitalia: vulva /labia no lesions Urethra: no prolapse Vagina: normal physiologic d/c, adequate room for TVh if need be  Cervix: no lesions, no cervical  motion tenderness   Uterus: normal size shape and  contour, non-tender Adnexa: no mass,  non-tender   Rectovaginal:  Impression:   The primary encounter diagnosis was Menorrhagia with irregular cycle.faile endometrial ablation   CPP with know history of endometriosis    Plan:   AFter a detailed discussion of options the patient has elected to undergo a LAVH ( already had a bilateral salpingectomy )to assess continuation of endometriosis. Possible right oophorectomy. She is away that pelvic pain my continue after hysterectomy given the progressive nature of endometriosis    Pt has been counseled regarding the risk of the procedure . All questions answered

## 2020-07-18 ENCOUNTER — Encounter
Admission: RE | Admit: 2020-07-18 | Discharge: 2020-07-18 | Disposition: A | Discharge status: Home / Self Care | Payer: BC Managed Care – PPO | Source: Ambulatory Visit | Attending: Obstetrics and Gynecology | Admitting: Obstetrics and Gynecology

## 2020-07-18 ENCOUNTER — Other Ambulatory Visit: Payer: Self-pay

## 2020-07-18 DIAGNOSIS — Z20822 Contact with and (suspected) exposure to covid-19: Secondary | ICD-10-CM | POA: Diagnosis not present

## 2020-07-18 DIAGNOSIS — I10 Essential (primary) hypertension: Secondary | ICD-10-CM | POA: Diagnosis not present

## 2020-07-18 DIAGNOSIS — Z01818 Encounter for other preprocedural examination: Secondary | ICD-10-CM | POA: Insufficient documentation

## 2020-07-18 LAB — BASIC METABOLIC PANEL
Anion gap: 11 (ref 5–15)
BUN: 16 mg/dL (ref 6–20)
CO2: 25 mmol/L (ref 22–32)
Calcium: 9.2 mg/dL (ref 8.9–10.3)
Chloride: 101 mmol/L (ref 98–111)
Creatinine, Ser: 0.72 mg/dL (ref 0.44–1.00)
GFR calc Af Amer: >60 mL/min (ref >60)
GFR calc non Af Amer: >60 mL/min (ref >60)
Glucose, Bld: 97 mg/dL (ref 70–99)
Potassium: 2.9 mmol/L — ABNORMAL LOW (ref 3.5–5.1)
Sodium: 137 mmol/L (ref 135–145)

## 2020-07-18 LAB — CBC
HCT: 32.7 % — ABNORMAL LOW (ref 36.0–46.0)
Hemoglobin: 11.6 g/dL — ABNORMAL LOW (ref 12.0–15.0)
MCH: 31.8 pg (ref 26.0–34.0)
MCHC: 35.5 g/dL (ref 30.0–36.0)
MCV: 89.6 fL (ref 80.0–100.0)
Platelets: 245 10*3/uL (ref 150–400)
RBC: 3.65 MIL/uL — ABNORMAL LOW (ref 3.87–5.11)
RDW: 12.8 % (ref 11.5–15.5)
WBC: 6.3 10*3/uL (ref 4.0–10.5)
nRBC: 0 % (ref 0.0–0.2)

## 2020-07-18 LAB — TYPE AND SCREEN
ABO/RH(D): O POS
Antibody Screen: NEGATIVE
Extend sample reason: UNDETERMINED

## 2020-07-18 LAB — SARS CORONAVIRUS 2 (TAT 6-24 HRS): SARS Coronavirus 2: NEGATIVE

## 2020-07-18 NOTE — Progress Notes (Signed)
  [facility] Perioperative Services: Pre-Admission/Anesthesia Testing  Abnormal Lab Notification   Date: 07/18/20  Name: Sandra Sanchez MRN:   [id number]  Re: Abnormal labs noted during PAT appointment   Provider(s) Notified: Schermerhorn, Ihor Austin, * Notification mode: Routed and/or faxed via [facility]   ABNORMAL LAB VALUE(S): Lab Results  Component Value Date   K 2.9 (L) 07/18/2020    Notes:  Patient on daily thiazide diuretic therapy (HCTZ 25). She is currently taking a potassium supplement (198 mg) daily. Patient is scheduled for a LAPAROSCOPIC ASSISTED VAGINAL HYSTERECTOMY and LAPAROSCOPIC OOPHORECTOMY on 07/20/2020. Will send result to surgeon for optimization. Will also have SDS staff recheck K+ level on the day of surgery to ensure correction of noted hypokalemia. This is a Personal assistant; no formal response is required.  Quentin Mulling, MSN, APRN, FNP-C, CEN [facility]  Peri-operative Services Nurse Practitioner Phone: [phone] 07/18/20 11:13 AM

## 2020-07-20 ENCOUNTER — Ambulatory Visit
Admission: RE | Admit: 2020-07-20 | Discharge: 2020-07-20 | Disposition: A | Discharge status: Home / Self Care | Payer: BC Managed Care – PPO | Attending: Obstetrics and Gynecology | Admitting: Obstetrics and Gynecology

## 2020-07-20 ENCOUNTER — Encounter: Payer: Self-pay | Admitting: Obstetrics and Gynecology

## 2020-07-20 ENCOUNTER — Ambulatory Visit: Payer: BC Managed Care – PPO

## 2020-07-20 ENCOUNTER — Ambulatory Visit: Payer: BC Managed Care – PPO | Admitting: Urgent Care

## 2020-07-20 ENCOUNTER — Encounter
Admission: RE | Disposition: A | Discharge status: Home / Self Care | Payer: Self-pay | Source: Home / Self Care | Attending: Obstetrics and Gynecology

## 2020-07-20 ENCOUNTER — Other Ambulatory Visit: Payer: Self-pay

## 2020-07-20 DIAGNOSIS — R102 Pelvic and perineal pain: Secondary | ICD-10-CM | POA: Insufficient documentation

## 2020-07-20 DIAGNOSIS — Z881 Allergy status to other antibiotic agents status: Secondary | ICD-10-CM | POA: Diagnosis not present

## 2020-07-20 DIAGNOSIS — Z87891 Personal history of nicotine dependence: Secondary | ICD-10-CM | POA: Insufficient documentation

## 2020-07-20 DIAGNOSIS — K219 Gastro-esophageal reflux disease without esophagitis: Secondary | ICD-10-CM | POA: Diagnosis not present

## 2020-07-20 DIAGNOSIS — Z7984 Long term (current) use of oral hypoglycemic drugs: Secondary | ICD-10-CM | POA: Insufficient documentation

## 2020-07-20 DIAGNOSIS — Z79899 Other long term (current) drug therapy: Secondary | ICD-10-CM | POA: Diagnosis not present

## 2020-07-20 DIAGNOSIS — F329 Major depressive disorder, single episode, unspecified: Secondary | ICD-10-CM | POA: Diagnosis not present

## 2020-07-20 DIAGNOSIS — E785 Hyperlipidemia, unspecified: Secondary | ICD-10-CM | POA: Diagnosis not present

## 2020-07-20 DIAGNOSIS — I10 Essential (primary) hypertension: Secondary | ICD-10-CM | POA: Insufficient documentation

## 2020-07-20 DIAGNOSIS — Z6839 Body mass index (BMI) 39.0-39.9, adult: Secondary | ICD-10-CM | POA: Insufficient documentation

## 2020-07-20 DIAGNOSIS — N920 Excessive and frequent menstruation with regular cycle: Secondary | ICD-10-CM | POA: Insufficient documentation

## 2020-07-20 DIAGNOSIS — Z791 Long term (current) use of non-steroidal anti-inflammatories (NSAID): Secondary | ICD-10-CM | POA: Insufficient documentation

## 2020-07-20 DIAGNOSIS — Z8349 Family history of other endocrine, nutritional and metabolic diseases: Secondary | ICD-10-CM | POA: Insufficient documentation

## 2020-07-20 DIAGNOSIS — G8929 Other chronic pain: Secondary | ICD-10-CM | POA: Diagnosis not present

## 2020-07-20 DIAGNOSIS — G43909 Migraine, unspecified, not intractable, without status migrainosus: Secondary | ICD-10-CM | POA: Diagnosis not present

## 2020-07-20 HISTORY — PX: CYSTOSCOPY: SHX5120

## 2020-07-20 HISTORY — PX: LAPAROSCOPIC ASSISTED VAGINAL HYSTERECTOMY: SHX5398

## 2020-07-20 HISTORY — PX: EXCISION OF ENDOMETRIOMA: SHX6473

## 2020-07-20 LAB — POCT I-STAT, CHEM 8
BUN: 17 mg/dL (ref 6–20)
Calcium, Ion: 0.82 mmol/L — CL (ref 1.15–1.40)
Chloride: 113 mmol/L — ABNORMAL HIGH (ref 98–111)
Creatinine, Ser: 0.7 mg/dL (ref 0.44–1.00)
Glucose, Bld: 102 mg/dL — ABNORMAL HIGH (ref 70–99)
HCT: 29 % — ABNORMAL LOW (ref 36.0–46.0)
Hemoglobin: 9.9 g/dL — ABNORMAL LOW (ref 12.0–15.0)
Potassium: 4.7 mmol/L (ref 3.5–5.1)
Sodium: 137 mmol/L (ref 135–145)
TCO2: 18 mmol/L — ABNORMAL LOW (ref 22–32)

## 2020-07-20 LAB — CBC
HCT: 31.1 % — ABNORMAL LOW (ref 36.0–46.0)
Hemoglobin: 11 g/dL — ABNORMAL LOW (ref 12.0–15.0)
MCH: 32.4 pg (ref 26.0–34.0)
MCHC: 35.4 g/dL (ref 30.0–36.0)
MCV: 91.7 fL (ref 80.0–100.0)
Platelets: 232 10*3/uL (ref 150–400)
RBC: 3.39 MIL/uL — ABNORMAL LOW (ref 3.87–5.11)
RDW: 13.2 % (ref 11.5–15.5)
WBC: 11.3 10*3/uL — ABNORMAL HIGH (ref 4.0–10.5)
nRBC: 0 % (ref 0.0–0.2)

## 2020-07-20 LAB — POCT PREGNANCY, URINE: Preg Test, Ur: NEGATIVE

## 2020-07-20 SURGERY — HYSTERECTOMY, VAGINAL, LAPAROSCOPY-ASSISTED
Anesthesia: General | Laterality: Right

## 2020-07-20 MED ORDER — SUGAMMADEX SODIUM 200 MG/2ML IV SOLN
INTRAVENOUS | Status: DC | PRN
  Administered 2020-07-20: 200 mg via INTRAVENOUS

## 2020-07-20 MED ORDER — SUCCINYLCHOLINE CHLORIDE 200 MG/10ML IV SOSY
PREFILLED_SYRINGE | INTRAVENOUS | Status: AC
  Filled 2020-07-20: qty 10

## 2020-07-20 MED ORDER — DEXAMETHASONE SODIUM PHOSPHATE 10 MG/ML IJ SOLN
INTRAMUSCULAR | Status: DC | PRN
  Administered 2020-07-20: 10 mg via INTRAVENOUS

## 2020-07-20 MED ORDER — ORAL CARE MOUTH RINSE: 15.0000 mL | Freq: Once | OROMUCOSAL | Status: AC

## 2020-07-20 MED ORDER — DEXMEDETOMIDINE HCL 200 MCG/2ML IV SOLN
INTRAVENOUS | Status: DC | PRN
  Administered 2020-07-20: 4 ug via INTRAVENOUS
  Administered 2020-07-20: 2 ug via INTRAVENOUS
  Administered 2020-07-20: 8 ug via INTRAVENOUS
  Administered 2020-07-20: 4 ug via INTRAVENOUS
  Administered 2020-07-20: 8 ug via INTRAVENOUS
  Administered 2020-07-20: 4 ug via INTRAVENOUS

## 2020-07-20 MED ORDER — LACTATED RINGERS IV SOLN: INTRAVENOUS | Status: DC

## 2020-07-20 MED ORDER — ACETAMINOPHEN 500 MG PO TABS
ORAL_TABLET | ORAL | Status: AC
  Filled 2020-07-20: qty 2

## 2020-07-20 MED ORDER — HYDROMORPHONE HCL 1 MG/ML IJ SOLN
INTRAMUSCULAR | Status: AC
  Filled 2020-07-20: qty 1

## 2020-07-20 MED ORDER — CEFAZOLIN SODIUM 1 G IJ SOLR
INTRAMUSCULAR | Status: AC
  Filled 2020-07-20: qty 20

## 2020-07-20 MED ORDER — ROCURONIUM BROMIDE 10 MG/ML (PF) SYRINGE
PREFILLED_SYRINGE | INTRAVENOUS | Status: AC
  Filled 2020-07-20: qty 10

## 2020-07-20 MED ORDER — POVIDONE-IODINE 10 % EX SWAB
2.0000 "application " | Freq: Once | CUTANEOUS | Status: AC
  Administered 2020-07-20: 2 via TOPICAL

## 2020-07-20 MED ORDER — FENTANYL CITRATE (PF) 100 MCG/2ML IJ SOLN
INTRAMUSCULAR | Status: AC
  Filled 2020-07-20: qty 2

## 2020-07-20 MED ORDER — HYDROMORPHONE HCL 1 MG/ML IJ SOLN
INTRAMUSCULAR | Status: DC | PRN
  Administered 2020-07-20 (×2): .5 mg via INTRAVENOUS

## 2020-07-20 MED ORDER — FENTANYL CITRATE (PF) 100 MCG/2ML IJ SOLN
INTRAMUSCULAR | Status: DC | PRN
Start: 2020-07-20 — End: 2020-07-20
  Administered 2020-07-20 (×2): 50 ug via INTRAVENOUS

## 2020-07-20 MED ORDER — GABAPENTIN 300 MG PO CAPS
ORAL_CAPSULE | ORAL | Status: AC
  Filled 2020-07-20: qty 1

## 2020-07-20 MED ORDER — DEXAMETHASONE SODIUM PHOSPHATE 10 MG/ML IJ SOLN
INTRAMUSCULAR | Status: AC
  Filled 2020-07-20: qty 1

## 2020-07-20 MED ORDER — OXYCODONE-ACETAMINOPHEN 5-325 MG PO TABS
ORAL_TABLET | ORAL | Status: AC
Start: 2020-07-20 — End: 2020-07-20
  Administered 2020-07-20: 1 via ORAL
  Filled 2020-07-20: qty 1

## 2020-07-20 MED ORDER — CEFAZOLIN SODIUM-DEXTROSE 2-4 GM/100ML-% IV SOLN
INTRAVENOUS | Status: AC
  Filled 2020-07-20: qty 100

## 2020-07-20 MED ORDER — PHENYLEPHRINE HCL (PRESSORS) 10 MG/ML IV SOLN
INTRAVENOUS | Status: DC | PRN
  Administered 2020-07-20 (×7): 100 ug via INTRAVENOUS

## 2020-07-20 MED ORDER — MIDAZOLAM HCL 2 MG/2ML IJ SOLN
INTRAMUSCULAR | Status: AC
  Filled 2020-07-20: qty 2

## 2020-07-20 MED ORDER — LIDOCAINE-EPINEPHRINE 1 %-1:100000 IJ SOLN
INTRAMUSCULAR | Status: DC | PRN
  Administered 2020-07-20: 7 mL

## 2020-07-20 MED ORDER — LIDOCAINE HCL (PF) 2 % IJ SOLN
INTRAMUSCULAR | Status: AC
  Filled 2020-07-20: qty 5

## 2020-07-20 MED ORDER — ONDANSETRON 4 MG PO TBDP: 4.0000 mg | ORAL_TABLET | Freq: Four times a day (QID) | ORAL | Status: DC | PRN

## 2020-07-20 MED ORDER — PROMETHAZINE HCL 25 MG/ML IJ SOLN: 6.2500 mg | INTRAMUSCULAR | Status: DC | PRN

## 2020-07-20 MED ORDER — BUPIVACAINE HCL 0.25 % IJ SOLN
INTRAMUSCULAR | Status: DC | PRN
  Administered 2020-07-20: 14 mL

## 2020-07-20 MED ORDER — GLYCOPYRROLATE 0.2 MG/ML IJ SOLN
INTRAMUSCULAR | Status: DC | PRN
  Administered 2020-07-20 (×2): .1 mg via INTRAVENOUS

## 2020-07-20 MED ORDER — PROPOFOL 10 MG/ML IV BOLUS
INTRAVENOUS | Status: AC
  Filled 2020-07-20: qty 20

## 2020-07-20 MED ORDER — CHLORHEXIDINE GLUCONATE 0.12 % MT SOLN
OROMUCOSAL | Status: AC
  Filled 2020-07-20: qty 15

## 2020-07-20 MED ORDER — SCOPOLAMINE 1 MG/3DAYS TD PT72
MEDICATED_PATCH | TRANSDERMAL | Status: AC
  Administered 2020-07-20: 1.5 mg via TRANSDERMAL
  Filled 2020-07-20: qty 1

## 2020-07-20 MED ORDER — LIDOCAINE-EPINEPHRINE 1 %-1:100000 IJ SOLN
INTRAMUSCULAR | Status: AC
  Filled 2020-07-20: qty 1

## 2020-07-20 MED ORDER — DEXMEDETOMIDINE (PRECEDEX) IN NS 20 MCG/5ML (4 MCG/ML) IV SYRINGE
PREFILLED_SYRINGE | INTRAVENOUS | Status: AC
  Filled 2020-07-20: qty 5

## 2020-07-20 MED ORDER — FLUORESCEIN SODIUM 10 % IV SOLN
INTRAVENOUS | Status: AC
  Filled 2020-07-20: qty 5

## 2020-07-20 MED ORDER — FENTANYL CITRATE (PF) 100 MCG/2ML IJ SOLN
25.0000 ug | INTRAMUSCULAR | Status: DC | PRN
  Administered 2020-07-20: 25 ug via INTRAVENOUS

## 2020-07-20 MED ORDER — OXYCODONE-ACETAMINOPHEN 5-325 MG PO TABS: 1.0000 | ORAL_TABLET | Freq: Once | ORAL | Status: AC

## 2020-07-20 MED ORDER — ACETAMINOPHEN 500 MG PO TABS
1000.0000 mg | ORAL_TABLET | ORAL | Status: AC
  Administered 2020-07-20: 1000 mg via ORAL

## 2020-07-20 MED ORDER — FENTANYL CITRATE (PF) 100 MCG/2ML IJ SOLN
INTRAMUSCULAR | Status: AC
Start: 2020-07-20 — End: ?
  Filled 2020-07-20: qty 2

## 2020-07-20 MED ORDER — ROCURONIUM BROMIDE 100 MG/10ML IV SOLN
INTRAVENOUS | Status: DC | PRN
  Administered 2020-07-20: 10 mg via INTRAVENOUS
  Administered 2020-07-20: 50 mg via INTRAVENOUS
  Administered 2020-07-20 (×5): 20 mg via INTRAVENOUS

## 2020-07-20 MED ORDER — CEFAZOLIN SODIUM-DEXTROSE 2-4 GM/100ML-% IV SOLN
2.0000 g | Freq: Once | INTRAVENOUS | Status: AC
  Administered 2020-07-20 (×2): 2 g via INTRAVENOUS

## 2020-07-20 MED ORDER — PROPOFOL 10 MG/ML IV BOLUS
INTRAVENOUS | Status: DC | PRN
  Administered 2020-07-20: 150 mg via INTRAVENOUS

## 2020-07-20 MED ORDER — BUPIVACAINE HCL (PF) 0.25 % IJ SOLN
INTRAMUSCULAR | Status: AC
  Filled 2020-07-20: qty 30

## 2020-07-20 MED ORDER — HEMOSTATIC AGENTS (NO CHARGE) OPTIME
TOPICAL | Status: DC | PRN
  Administered 2020-07-20: 1 via TOPICAL

## 2020-07-20 MED ORDER — SODIUM CHLORIDE 0.9 % IV SOLN
INTRAVENOUS | Status: DC | PRN
  Administered 2020-07-20: 20 ug/min via INTRAVENOUS

## 2020-07-20 MED ORDER — ONDANSETRON HCL 4 MG/2ML IJ SOLN
INTRAMUSCULAR | Status: AC
  Filled 2020-07-20: qty 2

## 2020-07-20 MED ORDER — LIDOCAINE HCL (CARDIAC) PF 100 MG/5ML IV SOSY
PREFILLED_SYRINGE | INTRAVENOUS | Status: DC | PRN
  Administered 2020-07-20: 100 mg via INTRAVENOUS

## 2020-07-20 MED ORDER — MIDAZOLAM HCL 2 MG/2ML IJ SOLN
INTRAMUSCULAR | Status: DC | PRN
  Administered 2020-07-20: 2 mg via INTRAVENOUS

## 2020-07-20 MED ORDER — EPHEDRINE SULFATE 50 MG/ML IJ SOLN
INTRAMUSCULAR | Status: DC | PRN
  Administered 2020-07-20 (×3): 10 mg via INTRAVENOUS

## 2020-07-20 MED ORDER — SCOPOLAMINE 1 MG/3DAYS TD PT72: 1.0000 | MEDICATED_PATCH | Freq: Once | TRANSDERMAL | Status: DC

## 2020-07-20 MED ORDER — ONDANSETRON HCL 4 MG/2ML IJ SOLN
INTRAMUSCULAR | Status: DC | PRN
  Administered 2020-07-20: 4 mg via INTRAVENOUS

## 2020-07-20 MED ORDER — CHLORHEXIDINE GLUCONATE 0.12 % MT SOLN
15.0000 mL | Freq: Once | OROMUCOSAL | Status: AC
  Administered 2020-07-20: 15 mL via OROMUCOSAL

## 2020-07-20 MED ORDER — GABAPENTIN 300 MG PO CAPS
300.0000 mg | ORAL_CAPSULE | ORAL | Status: AC
  Administered 2020-07-20: 300 mg via ORAL

## 2020-07-20 SURGICAL SUPPLY — 77 items
APL PRP STRL LF DISP 70% ISPRP (MISCELLANEOUS) ×3
APL SRG 38 LTWT LNG FL B (MISCELLANEOUS) ×3
APPLICATOR ARISTA FLEXITIP XL (MISCELLANEOUS) ×5 IMPLANT
APPLICATOR SWAB PROCTO LG 16IN (MISCELLANEOUS) ×5 IMPLANT
BAG DRN RND TRDRP ANRFLXCHMBR (UROLOGICAL SUPPLIES) ×3
BAG SPEC RTRVL LRG 6X4 10 (ENDOMECHANICALS)
BAG URINE DRAIN 2000ML AR STRL (UROLOGICAL SUPPLIES) ×5 IMPLANT
BLADE SURG SZ11 CARB STEEL (BLADE) ×5 IMPLANT
CANISTER SUCT 1200ML W/VALVE (MISCELLANEOUS) IMPLANT
CATH FOLEY 2WAY  5CC 16FR (CATHETERS) ×2
CATH FOLEY 2WAY 5CC 16FR (CATHETERS) ×3
CATH ROBINSON RED A/P 16FR (CATHETERS) ×5 IMPLANT
CATH URTH 16FR FL 2W BLN LF (CATHETERS) ×3 IMPLANT
CHLORAPREP W/TINT 26 (MISCELLANEOUS) ×5 IMPLANT
CLOSURE WOUND 1/2 X4 (GAUZE/BANDAGES/DRESSINGS) ×1
CLOSURE WOUND 1/4X4 (GAUZE/BANDAGES/DRESSINGS) ×1
COVER LIGHT HANDLE STERIS (MISCELLANEOUS) ×5 IMPLANT
COVER WAND RF STERILE (DRAPES) ×5 IMPLANT
DEFOGGER SCOPE WARMER CLEARIFY (MISCELLANEOUS) ×5 IMPLANT
DRAPE SURG 17X11 SM STRL (DRAPES) ×5 IMPLANT
DRSG TEGADERM 2-3/8X2-3/4 SM (GAUZE/BANDAGES/DRESSINGS) ×20 IMPLANT
ELECT BLADE 6.5 EXT (BLADE) ×5 IMPLANT
ELECT REM PT RETURN 9FT ADLT (ELECTROSURGICAL) ×5
ELECTRODE REM PT RTRN 9FT ADLT (ELECTROSURGICAL) ×3 IMPLANT
FILTER LAP SMOKE EVAC STRL (MISCELLANEOUS) ×5 IMPLANT
GAUZE 4X4 16PLY RFD (DISPOSABLE) ×10 IMPLANT
GAUZE SPONGE 4X4 16PLY XRAY LF (GAUZE/BANDAGES/DRESSINGS) ×10 IMPLANT
GLOVE SURG SYN 8.0 (GLOVE) ×80 IMPLANT
GOWN STRL REUS W/ TWL LRG LVL3 (GOWN DISPOSABLE) ×9 IMPLANT
GOWN STRL REUS W/ TWL XL LVL3 (GOWN DISPOSABLE) ×3 IMPLANT
GOWN STRL REUS W/TWL LRG LVL3 (GOWN DISPOSABLE) ×15
GOWN STRL REUS W/TWL XL LVL3 (GOWN DISPOSABLE) ×5
GRASPER SUT TROCAR 14GX15 (MISCELLANEOUS) ×5 IMPLANT
HEMOSTAT ARISTA ABSORB 3G PWDR (HEMOSTASIS) ×5 IMPLANT
IRRIGATION STRYKERFLOW (MISCELLANEOUS) ×3 IMPLANT
IRRIGATOR STRYKERFLOW (MISCELLANEOUS) ×5
IV LACTATED RINGERS 1000ML (IV SOLUTION) ×5 IMPLANT
IV NS 1000ML (IV SOLUTION) ×5
IV NS 1000ML BAXH (IV SOLUTION) ×3 IMPLANT
KIT PINK PAD W/HEAD ARE REST (MISCELLANEOUS) ×5
KIT PINK PAD W/HEAD ARM REST (MISCELLANEOUS) ×3 IMPLANT
KIT TURNOVER CYSTO (KITS) ×5 IMPLANT
LABEL OR SOLS (LABEL) ×5 IMPLANT
NEEDLE HYPO 22GX1.5 SAFETY (NEEDLE) ×5 IMPLANT
NS IRRIG 500ML POUR BTL (IV SOLUTION) ×5 IMPLANT
PACK BASIN MINOR (MISCELLANEOUS) ×5 IMPLANT
PACK GYN LAPAROSCOPIC (MISCELLANEOUS) ×5 IMPLANT
PAD OB MATERNITY 4.3X12.25 (PERSONAL CARE ITEMS) ×5 IMPLANT
PAD PREP 24X41 OB/GYN DISP (PERSONAL CARE ITEMS) ×5 IMPLANT
PENCIL ELECTRO HAND CTR (MISCELLANEOUS) ×5 IMPLANT
POUCH SPECIMEN RETRIEVAL 10MM (ENDOMECHANICALS) IMPLANT
SET CYSTO W/LG BORE CLAMP LF (SET/KITS/TRAYS/PACK) ×5 IMPLANT
SET TUBE SMOKE EVAC HIGH FLOW (TUBING) ×5 IMPLANT
SHEARS HARMONIC ACE PLUS 36CM (ENDOMECHANICALS) ×5 IMPLANT
SLEEVE ENDOPATH XCEL 5M (ENDOMECHANICALS) ×5 IMPLANT
SPONGE GAUZE 2X2 8PLY STER LF (GAUZE/BANDAGES/DRESSINGS) ×3
SPONGE GAUZE 2X2 8PLY STRL LF (GAUZE/BANDAGES/DRESSINGS) ×12 IMPLANT
STRIP CLOSURE SKIN 1/2X4 (GAUZE/BANDAGES/DRESSINGS) ×4 IMPLANT
STRIP CLOSURE SKIN 1/4X4 (GAUZE/BANDAGES/DRESSINGS) ×4 IMPLANT
SUT PDS 2-0 27IN (SUTURE) ×5 IMPLANT
SUT VIC AB 0 CT1 27 (SUTURE) ×15
SUT VIC AB 0 CT1 27XCR 8 STRN (SUTURE) ×9 IMPLANT
SUT VIC AB 0 CT1 36 (SUTURE) ×10 IMPLANT
SUT VIC AB 0 CT2 27 (SUTURE) ×5 IMPLANT
SUT VIC AB 2-0 SH 27 (SUTURE) ×5
SUT VIC AB 2-0 SH 27XBRD (SUTURE) ×3 IMPLANT
SUT VIC AB 2-0 UR6 27 (SUTURE) ×15 IMPLANT
SUT VIC AB 4-0 SH 27 (SUTURE) ×20
SUT VIC AB 4-0 SH 27XANBCTRL (SUTURE) ×12 IMPLANT
SWABSTK COMLB BENZOIN TINCTURE (MISCELLANEOUS) ×5 IMPLANT
SYR 10ML LL (SYRINGE) ×5 IMPLANT
SYR CONTROL 10ML LL (SYRINGE) ×5 IMPLANT
TROCAR 5M 150ML BLDLS (TROCAR) ×5 IMPLANT
TROCAR ENDO BLADELESS 11MM (ENDOMECHANICALS) IMPLANT
TROCAR XCEL BLUNT TIP 100MML (ENDOMECHANICALS) ×5 IMPLANT
TROCAR XCEL NON-BLD 5MMX100MML (ENDOMECHANICALS) ×5 IMPLANT
TUBING EVAC SMOKE HEATED PNEUM (TUBING) ×5 IMPLANT

## 2020-07-20 NOTE — Transfer of Care (Signed)
Immediate Anesthesia Transfer of Care Note  Patient: Sandra Sanchez  Procedure(s) Performed: LAPAROSCOPIC ASSISTED VAGINAL HYSTERECTOMY (N/A ) LAPAROSCOPIC OOPHORECTOMY (Right ) CYSTOSCOPY EXCISION OF ENDOMETRIOMA  Patient Location: PACU  Anesthesia Type:General  Level of Consciousness: awake  Airway & Oxygen Therapy: Patient Spontanous Breathing  Post-op Assessment: Report given to RN  Post vital signs: stable  Last Vitals:  Vitals Value Taken Time  BP 116/81 07/20/20 1508  Temp    Pulse 95 07/20/20 1513  Resp 18 07/20/20 1513  SpO2 99 % 07/20/20 1513  Vitals shown include unvalidated device data.  Last Pain:  Vitals:   07/20/20 1511  TempSrc:   PainSc: (P) 0-No pain         Complications: No complications documented.

## 2020-07-20 NOTE — Progress Notes (Signed)
Pt here for LAVH and possible right salpingectomy  LAbs reviewed . K+ level now corrected with po K+ . All questions answered . Proceed

## 2020-07-20 NOTE — Op Note (Signed)
Sandra Sanchez, ESTILL MEDICAL RECORD [id number] ACCOUNT [id number] DATE OF BIRTH:August 31, 1986 FACILITY: [facility] LOCATION: [facility] PHYSICIAN:Clifton Kovacic [name], MD  OPERATIVE REPORT  DATE OF PROCEDURE:  07/20/2020  PREOPERATIVE DIAGNOSES: 1.  Chronic pelvic pain. 2.  Menorrhagia, status post failed endometrial ablation.  POSTOPERATIVE DIAGNOSES: 1.  Chronic pelvic pain. 2.  Endometriosis. 3.  Menorrhagia, status post failed endometrial ablation. 4.  Pelvic adhesions.  PROCEDURES: 1.  Laparoscopic-assisted vaginal hysterectomy. 2.  Right oophorectomy. 3.  Excision of right sidewall endometriosis. 4.  Cystoscopy.  ANESTHESIA:  General endotracheal anesthesia.  SURGEON:  Jennell Corner, MD.  FIRST ASSISTANT:  Christeen Douglas, MD   INDICATIONS:  A 34 year old female with a known history of endometriosis and a history of chronic pelvic pain.  The patient had previously undergone endometrial ablation by another gynecologist.  The patient continues to have heavy bleeding and pelvic  pain.  FINDINGS:  Right pelvic sidewall endometriosis.  Right ovary adhesions to the right sidewall.  DESCRIPTION OF PROCEDURE:  After adequate general endotracheal anesthesia, the patient was placed in dorsal supine position.  The legs were placed in the [facility] stirrups.  The patient's abdomen, perineum and vagina were prepped and draped in normal  sterile fashion.  Timeout was performed.  The patient did receive 2 g IV Ancef for surgical prophylaxis.  Speculum was placed into the vagina and the anterior cervix was grasped with a single-tooth tenaculum and a Kahn cannula was placed into the  endocervical canal to be used for uterine manipulation during the procedure.  Foley catheter was placed in the bladder, yielded 200 mL of urine initially.  Gloves and gown were changed.  Attention was directed to the patient's abdomen.  A 5 mm incision  was made in the infraumbilical area after injecting  with 0.5% Marcaine.  Several attempts to place the 5 mm trocar were unsuccessful to gain entrance into the abdominal cavity.  A longer 5 mm trocar was attempted as well.  This was also unsuccessful.An attempt at placing a 5 mm port at palmer's point was also unsuccessful .  An  open infraumbilical entrance  Was then  made after making a 15 mm infraumbilical incision and the fascia was opened, followed by the peritoneum and once gaining entrance into the peritoneal cavity, the Hasson cannula was placed into the abdominal cavity and  anchored to the skin.  A 10 mm scope was then advanced into the abdominal cavity.  Second port placement was left lower quadrant, 3 cm medial to the left anterior iliac spine and under direct visualization, the trocar was advanced.  A third port was  placed in the right lower quadrant, again 3 cm medial to the right anterior iliac spine and under direct visualization, a trocar was advanced.  Initial impression revealed the right cornual aspect of the uterus with endometriosis, the right ovarian fossa  with stellate and powder burn areas consistent with endometriosis and the right ovary was densely adherent to the right ovarian fossa.  The ureters were identified bilaterally with good peristaltic activity.  The right infundibulopelvic ligament was  then cauterized with the Kleppinger cautery and the Harmonic scalpel was used to dissect and transect the right infundibulopelvic ligament.  The fallopian tubes had been previously removed.  The right broad ligament was then opened after opening the  round ligament on the right.  The right uterine artery was identified, skeletonized, cauterized and transected.  Attention was directed to the patient's left side of the uterus where the round ligament was  cauterized and opened and the left uteroovarian  ligament was transected and the left broad ligament opened, followed by skeletization of the left uterine artery, cauterization and  transection of the artery.  Good hemostasis was noted.  At this point, surgery commenced from a vaginal approach.  A  weighted speculum was placed into the vagina and the cervix was grasped with 2 thyroid tenacula and the cervix was circumferentially injected with 0.5% lidocaine with 1:100,000 epinephrine.  The posterior cul-de-sac was placed on tension and a direct  posterior colpotomy incision was made.  Upon entry into the posterior cul-de-sac, a long-billed speculum was placed.  The patient's anatomy made the vaginal approach difficult, with difficulty keeping the long- billed speculum into the posterior  cul-de-sac.  The uterosacral ligaments were then bilaterally clamped, transected, suture ligated with 0 Vicryl suture and tagged for later identification.  Anterior cervix was circumferentially incised and the anterior cul-de-sac was entered without  difficulty.  Deaver retractor was placed within to elevate the bladder anteriorly.  The cardinal ligaments were then bilaterally clamped, transected, suture ligated with 0 Vicryl suture and 1 additional clamping on each side allowed for delivery of the  cervix, uterus and right ovary.  Both of those pedicles were ligated with 0 Vicryl suture.  Three additional figure-of-eight sutures were required for the sidewalls to control bleeding and the vaginal cuff was then closed with a 0 Vicryl suture, running  nonlocking fashion.  The Foley catheter, which had been removed when the surgeons proceeded vaginally, was replaced at the end and verified normal urine output.  The gloves and gowns were changed yet again and attention was directed to the abdominal area  again.  The patient's abdomen was reinsufflated and there seemed to be some continued oozing from the vaginal cuff which could not be controlled with cautery.  Visualization was poor from the 10 mm scope and ultimately was identified that the port site  had continued slow bleeding that obscured the view of  the scope and required multiple cleaning to visualize the pelvis.  Given the continued oozing from the infraumbilical incision, it was decided that the Hasson cannula would be removed and the fascia  was then closed with a Carter-Thomason cone and PMI.  Fascia was closed with no defects noted.  Two separate figure-of-eight sutures were placed and there was continued oozing in the subcutaneous area, which ultimately was controlled with Bovie and  placement of 2-0 Vicryl sutures to control the bleeding.  Once the bleeding was secured, attention was then redirected down to the pelvis with placement of a 4th port site, this time 2 cm above the symphysis pubis.  A 5 mm trocar was advanced under  direct visualization utilizing a 5 mm scope through the side port.  The bowel was then pushed from the pelvis and the patient was in deep Trendelenburg and there continued to be oozing from the vaginal cuff site.  At this point, surgeons returned  vaginally and 5 separate figure-of-eight sutures were placed at the cuff to reinforce the cuff and to control bleeding.  Good hemostasis noted at that point.   A cystoscopy was performed  during this time as well to ensure that there was no ureteral  injury and there was normal filling of the bladder with normal pluming effect of each ureteral orifice, with normal urine effluxing into the bladder.  Gloves and gown were changed yet again and attention was directed back up to the abdominal cavity where  the patient's  abdomen was reinsufflated.  At this point, good hemostasis was noted.  The 1 powder burn area in the right sidewall was grasped with a [place] grasper and the Harmonic scalpel was used to excise this endometriosis and this will be sent to  pathology for identification.  Pressure was lowered to 7 mmHg and good hemostasis was noted and the patient's abdomen was deflated and all incisions, the 4 previously mentioned port sites and the 1 port site at Palmer's point, which  was attempted at the  beginning of the case, but failed to gain entrance into the abdominal cavity.  These port sites were closed with 4-0 Vicryl suture and dressings were applied.  The patient tolerated the procedure well.  ESTIMATED BLOOD LOSS:  150 mL.  INTRAOPERATIVE FLUIDS:  1600 mL.  URINE OUTPUT:  350 mL.    ANTIBIOTICS:  The patient did receive a second 2 g intravenous dosing of Ancef given the duration of the case.  DISPOSITION:  The patient was taken to recovery room in good condition.  VN/NUANCE  D:07/20/2020 T:07/20/2020 JOB:[id number]

## 2020-07-20 NOTE — Anesthesia Procedure Notes (Signed)
Procedure Name: Intubation Date/Time: 07/20/2020 9:45 AM Performed by: Felix Ahmadi, RN Pre-anesthesia Checklist: Patient identified, Patient being monitored, Timeout performed, Emergency Drugs available and Suction available Patient Re-evaluated:Patient Re-evaluated prior to induction Oxygen Delivery Method: Circle system utilized Preoxygenation: Pre-oxygenation with 100% oxygen Induction Type: IV induction Ventilation: Mask ventilation without difficulty Laryngoscope Size: Miller and 2 Grade View: Grade I Tube type: Oral Tube size: 7.0 mm Number of attempts: 1 Airway Equipment and Method: Stylet Placement Confirmation: ETT inserted through vocal cords under direct vision,  positive ETCO2 and breath sounds checked- equal and bilateral Secured at: 21 cm Tube secured with: Tape Dental Injury: Teeth and Oropharynx as per pre-operative assessment

## 2020-07-20 NOTE — Anesthesia Preprocedure Evaluation (Signed)
Anesthesia Evaluation  Patient identified by MRN, date of birth, ID band Patient awake    Reviewed: Allergy & Precautions, H&P , NPO status , Patient's Chart, lab work & pertinent test results, reviewed documented beta blocker date and time   History of Anesthesia Complications Negative for: history of anesthetic complications  Airway Mallampati: I  TM Distance: >3 FB Neck ROM: full    Dental  (+) Dental Advidsory Given, Missing, Teeth Intact, Caps   Pulmonary neg pulmonary ROS, former smoker,    Pulmonary exam normal        Cardiovascular Exercise Tolerance: Good hypertension, (-) angina(-) CAD, (-) Past MI, (-) Cardiac Stents and (-) CABG Normal cardiovascular exam+ dysrhythmias (-) Valvular Problems/Murmurs     Neuro/Psych PSYCHIATRIC DISORDERS Anxiety Depression negative neurological ROS     GI/Hepatic Neg liver ROS, GERD  ,  Endo/Other  neg diabetesHypothyroidism   Renal/GU negative Renal ROS  negative genitourinary   Musculoskeletal   Abdominal   Peds  Hematology  (+) Blood dyscrasia, anemia ,   Anesthesia Other Findings Past Medical History: No date: Anemia No date: Anxiety No date: Depression No date: GERD (gastroesophageal reflux disease)     Comment:  RARE No date: High cholesterol No date: Hypertension No date: Hypothyroidism     Comment:  H/O WITH PREGNANCY ONLY No date: Obesity   Reproductive/Obstetrics negative OB ROS                             Anesthesia Physical  Anesthesia Plan  ASA: III  Anesthesia Plan: General   Post-op Pain Management:    Induction: Intravenous  PONV Risk Score and Plan: 3 and Ondansetron, Dexamethasone, Midazolam, Promethazine, Treatment may vary due to age or medical condition and Scopolamine patch - Pre-op  Airway Management Planned: Oral ETT  Additional Equipment:   Intra-op Plan:   Post-operative Plan: Extubation in  OR  Informed Consent: I have reviewed the patients History and Physical, chart, labs and discussed the procedure including the risks, benefits and alternatives for the proposed anesthesia with the patient or authorized representative who has indicated his/her understanding and acceptance.     Dental Advisory Given  Plan Discussed with: Anesthesiologist, CRNA and Surgeon  Anesthesia Plan Comments:         Anesthesia Quick Evaluation

## 2020-07-20 NOTE — Discharge Instructions (Signed)

## 2020-07-20 NOTE — Brief Op Note (Signed)
07/20/2020  2:57 PM  PATIENT:  Sandra Sanchez  34 y.o. female  PRE-OPERATIVE DIAGNOSIS:  menorrhagia, failed ablation Chronic pelvic pain  POST-OPERATIVE DIAGNOSIS:  menorrhagia, failed ablation Chronic pelvic pain , endometriosis PROCEDURE:  LAVH , Right oophorectomy  Excision of endometriosis Cystoscopy   SURGEON:  Surgeon(s) and Role:    * Ataya Murdy, Ihor Austin, MD - Primary    * Christeen Douglas, MD - Assisting  PHYSICIAN ASSISTANT:   ASSISTANTS: none   ANESTHESIA:   general  EBL:  150 mL   BLOOD ADMINISTERED:none  DRAINS: none   LOCAL MEDICATIONS USED:  MARCAINE     SPECIMEN:  Source of Specimen:  cervix , right ovary, uterus and endometriosis excision   DISPOSITION OF SPECIMEN:  PATHOLOGY  COUNTS:  YES  TOURNIQUET:  * No tourniquets in log *  DICTATION: .Other Dictation: Dictation Number verbal  PLAN OF CARE: Discharge to home after PACU  PATIENT DISPOSITION:  PACU - hemodynamically stable.   Delay start of Pharmacological VTE agent (>24hrs) due to surgical blood loss or risk of bleeding: not applicable

## 2020-07-23 ENCOUNTER — Encounter: Payer: Self-pay | Admitting: Obstetrics and Gynecology

## 2020-07-23 NOTE — Anesthesia Postprocedure Evaluation (Signed)
Anesthesia Post Note  Patient: Sandra Sanchez  Procedure(s) Performed: LAPAROSCOPIC ASSISTED VAGINAL HYSTERECTOMY (N/A ) LAPAROSCOPIC OOPHORECTOMY (Right ) CYSTOSCOPY EXCISION OF ENDOMETRIOMA  Patient location during evaluation: PACU Anesthesia Type: General Level of consciousness: awake and alert Pain management: pain level controlled Vital Signs Assessment: post-procedure vital signs reviewed and stable Respiratory status: spontaneous breathing, nonlabored ventilation, respiratory function stable and patient connected to nasal cannula oxygen Cardiovascular status: blood pressure returned to baseline and stable Postop Assessment: no apparent nausea or vomiting Anesthetic complications: no   No complications documented.   Last Vitals:  Vitals:   07/20/20 1900 07/20/20 1944  BP: 111/68 125/78  Pulse: 90 90  Resp: 16 16  Temp:  36.5 C  SpO2: 100% 100%    Last Pain:  Vitals:   07/20/20 1930  TempSrc:   PainSc: 4                  Lenard Simmer

## 2020-07-24 LAB — SURGICAL PATHOLOGY

## 2020-07-25 ENCOUNTER — Other Ambulatory Visit: Payer: BLUE CROSS/BLUE SHIELD

## 2020-08-01 ENCOUNTER — Other Ambulatory Visit: Payer: BLUE CROSS/BLUE SHIELD

## 2020-08-21 ENCOUNTER — Other Ambulatory Visit: Payer: Self-pay | Admitting: Surgery

## 2020-08-21 DIAGNOSIS — R1030 Lower abdominal pain, unspecified: Secondary | ICD-10-CM

## 2020-09-05 ENCOUNTER — Ambulatory Visit: Payer: BC Managed Care – PPO

## 2020-09-07 ENCOUNTER — Ambulatory Visit
Admission: RE | Admit: 2020-09-07 | Discharge: 2020-09-07 | Disposition: A | Discharge status: Home / Self Care | Payer: BC Managed Care – PPO | Source: Ambulatory Visit | Attending: Surgery | Admitting: Surgery

## 2020-09-07 ENCOUNTER — Other Ambulatory Visit: Payer: Self-pay

## 2020-09-07 DIAGNOSIS — R1030 Lower abdominal pain, unspecified: Secondary | ICD-10-CM | POA: Diagnosis not present

## 2020-09-07 LAB — POCT I-STAT CREATININE: Creatinine, Ser: 1.1 mg/dL — ABNORMAL HIGH (ref 0.44–1.00)

## 2020-09-07 MED ORDER — IOHEXOL 300 MG/ML  SOLN
100.0000 mL | Freq: Once | INTRAMUSCULAR | Status: AC | PRN
  Administered 2020-09-07: 100 mL via INTRAVENOUS

## 2023-07-31 ENCOUNTER — Other Ambulatory Visit: Payer: Self-pay | Admitting: Student

## 2023-07-31 DIAGNOSIS — M4807 Spinal stenosis, lumbosacral region: Secondary | ICD-10-CM

## 2023-07-31 DIAGNOSIS — M47816 Spondylosis without myelopathy or radiculopathy, lumbar region: Secondary | ICD-10-CM

## 2023-07-31 DIAGNOSIS — G8929 Other chronic pain: Secondary | ICD-10-CM

## 2023-08-11 ENCOUNTER — Ambulatory Visit
Admission: RE | Admit: 2023-08-11 | Discharge: 2023-08-11 | Disposition: A | Discharge status: Home / Self Care | Payer: Commercial Managed Care - PPO | Source: Ambulatory Visit | Attending: Student | Admitting: Student

## 2023-08-11 DIAGNOSIS — M47816 Spondylosis without myelopathy or radiculopathy, lumbar region: Secondary | ICD-10-CM

## 2023-08-11 DIAGNOSIS — M4807 Spinal stenosis, lumbosacral region: Secondary | ICD-10-CM

## 2023-08-11 DIAGNOSIS — G8929 Other chronic pain: Secondary | ICD-10-CM

## 2023-09-25 ENCOUNTER — Ambulatory Visit
Admission: RE | Admit: 2023-09-25 | Discharge: 2023-09-25 | Disposition: A | Discharge status: Home / Self Care | Payer: Commercial Managed Care - PPO | Source: Ambulatory Visit

## 2023-09-25 VITALS — BP 126/83 | HR 89 | Temp 98.7°F | Resp 14 | Ht 65.0 in | Wt 235.0 lb

## 2023-09-25 DIAGNOSIS — L03032 Cellulitis of left toe: Secondary | ICD-10-CM

## 2023-09-25 DIAGNOSIS — M79675 Pain in left toe(s): Secondary | ICD-10-CM | POA: Diagnosis not present

## 2023-09-25 MED ORDER — DOXYCYCLINE HYCLATE 100 MG PO CAPS: 100.0000 mg | ORAL_CAPSULE | Freq: Two times a day (BID) | ORAL | 0 refills | Status: AC

## 2023-09-25 MED ORDER — MUPIROCIN 2 % EX OINT: 1.0000 | TOPICAL_OINTMENT | Freq: Two times a day (BID) | CUTANEOUS | 0 refills | Status: DC

## 2023-09-25 NOTE — ED Triage Notes (Signed)
Patient reports pain and swelling in her left big toe for the past 5 days.  Patient denies injury or fall.

## 2023-09-25 NOTE — Discharge Instructions (Signed)
-  I am unable to see any area of ingrown toenail and a large portion of the nail has already  been removed -Continue with soaks and begin using Mupirocin ointment -I sent an antibiotic for developing skin infection -Follow up with podiatrist in a few days as scheduled.

## 2023-09-25 NOTE — ED Provider Notes (Signed)
[facility]    CSN: [id number] Arrival date & time: 09/25/23  1538      History   Chief Complaint Chief Complaint  Patient presents with   Toe Pain    Left big toe    HPI CHERI CAINS is a 37 y.o. female presenting for mild pain (1/10) and redness of the left great toe for the past few days. She says she had discomfort of the area a couple of months ago but the podiatrist did not think there was an ingrown toenail at that time and recommended supportive care. She now thinks there is one and cut a portion of the nail out herself. She has been soaking the foot and applying Neosporin. Has appointment to see her podiatrist in a few days for evaluation. No fever, bleeding, significant swelling or pustular drainage. No other concerns.  HPI  Past Medical History:  Diagnosis Date   Anemia    Anxiety    Depression    GERD (gastroesophageal reflux disease)    RARE   High cholesterol    Hypertension    Hypothyroidism    H/O WITH PREGNANCY ONLY   Obesity     Patient Active Problem List   Diagnosis Date Noted   Endometriosis determined by laparoscopy 11/02/2018    Past Surgical History:  Procedure Laterality Date   ABDOMINAL HYSTERECTOMY     CYSTOSCOPY  07/20/2020   Procedure: CYSTOSCOPY;  Surgeon: Schermerhorn, Ihor Austin, MD;  Location: [facility];  Service: Gynecology;;   EXCISION OF ENDOMETRIOMA  07/20/2020   Procedure: EXCISION OF ENDOMETRIOMA;  Surgeon: Suzy Bouchard, MD;  Location: [facility];  Service: Gynecology;;  excision of endometriosis   HYSTEROSCOPY WITH D & C N/A 10/26/2018   Procedure: DILATATION AND CURETTAGE /HYSTEROSCOPY, ENDOMETRIAL ABLATION WITH NOVASURE;  Surgeon: Natale Milch, MD;  Location: [facility];  Service: Gynecology;  Laterality: N/A;   LAPAROSCOPIC ASSISTED VAGINAL HYSTERECTOMY N/A 07/20/2020   Procedure: LAPAROSCOPIC ASSISTED VAGINAL HYSTERECTOMY;  Surgeon: Suzy Bouchard, MD;  Location: [facility];  Service:  Gynecology;  Laterality: N/A;   LAPAROSCOPIC BILATERAL SALPINGECTOMY Bilateral 10/26/2018   Procedure: LAPAROSCOPIC BILATERAL SALPINGECTOMY;  Surgeon: Natale Milch, MD;  Location: [facility];  Service: Gynecology;  Laterality: Bilateral;   MANDIBLE FRACTURE SURGERY      OB History     Gravida  2   Para  2   Term  2   Preterm      AB      Living  2      SAB      IAB      Ectopic      Multiple      Live Births  2            Home Medications    Prior to Admission medications   Medication Sig Start Date End Date Taking? Authorizing Provider  buPROPion (WELLBUTRIN XL) 150 MG 24 hr tablet Take 150 mg by mouth every morning.  06/16/18  Yes [provider]  doxycycline (VIBRAMYCIN) 100 MG capsule Take 1 capsule (100 mg total) by mouth 2 (two) times daily for 7 days. 09/25/23 10/02/23 Yes Shirlee Latch, PA-C  famotidine (PEPCID) 40 MG tablet Take 40 mg by mouth daily.   Yes [provider]  hydrochlorothiazide (HYDRODIURIL) 25 MG tablet Take 25 mg by mouth daily.  07/23/17  Yes [provider]  labetalol (NORMODYNE) 200 MG tablet Take 400 mg by mouth daily.  03/30/17  Yes  [provider]  magnesium oxide (MAG-OX) 400 MG tablet Take 800 mg by mouth at bedtime.    Yes [provider]  metFORMIN (GLUCOPHAGE-XR) 750 MG 24 hr tablet Take 1,500 mg by mouth at bedtime.  03/01/18  Yes [provider]  mupirocin ointment (BACTROBAN) 2 % Apply 1 Application topically 2 (two) times daily. 09/25/23  Yes Eusebio Friendly B, PA-C  phentermine (ADIPEX-P) 37.5 MG tablet Take by mouth. 08/24/23 02/20/24 Yes [provider]  rosuvastatin (CRESTOR) 5 MG tablet SMARTSIG:1.0 Tablet(s) By Mouth Daily   Yes [provider]  amLODipine (NORVASC) 2.5 MG tablet SMARTSIG:1.0 Tablet(s) By Mouth Twice Daily    [provider]  atorvastatin (LIPITOR) 40 MG tablet Take 40 mg by mouth at bedtime.  Patient not taking: Reported  on 07/03/2020 09/03/18   [provider]  busPIRone (BUSPAR) 5 MG tablet Take 5 mg by mouth 2 (two) times daily.    [provider]  celecoxib (CELEBREX) 200 MG capsule SMARTSIG:1.0 Tablet(s) By Mouth Twice Daily    [provider]  Cholecalciferol (VITAMIN D) 50 MCG (2000 UT) tablet Take 4,000 Units by mouth at bedtime.     [provider]  ibuprofen (ADVIL,MOTRIN) 600 MG tablet Take 1 tablet (600 mg total) by mouth every 6 (six) hours as needed. Patient not taking: Reported on 07/03/2020 10/26/18   Natale Milch, MD  Boris Lown Oil 1000 MG CAPS Take 1,000 mg by mouth daily.    [provider]  omeprazole (PRILOSEC) 40 MG capsule SMARTSIG:1.0 Capsule(s) By Mouth Daily    [provider]  potassium chloride (KLOR-CON M) 10 MEQ tablet SMARTSIG:1.0 Tablet(s) By Mouth Daily    [provider]  tiZANidine (ZANAFLEX) 4 MG tablet SMARTSIG:1.0 Tablet(s) By Mouth Twice Daily    [provider]  topiramate (TOPAMAX) 50 MG tablet SMARTSIG:1.0 Tablet(s) By Mouth Twice Daily    [provider]  venlafaxine XR (EFFEXOR-XR) 150 MG 24 hr capsule SMARTSIG:1.0 Capsule(s) By Mouth Daily    [provider]    Family History Family History  Problem Relation Age of Onset   Atrial fibrillation Mother    Lung cancer Mother    Heart attack Father        cardiac arrest    Social History Social History   Tobacco Use   Smoking status: Former    Current packs/day: 0.00    Average packs/day: 1 pack/day for 15.0 years (15.0 ttl pk-yrs)    Types: Cigarettes    Start date: 02/19/2003    Quit date: 02/18/2018    Years since quitting: 5.6   Smokeless tobacco: Never  Vaping Use   Vaping status: Former   Substances: Nicotine  Substance Use Topics   Alcohol use: Yes    Comment: rarely   Drug use: No     Allergies   Atorvastatin and Clindamycin   Review of Systems Review of Systems  Constitutional:  Negative for fatigue  and fever.  Musculoskeletal:  Negative for arthralgias and joint swelling.  Skin:  Positive for color change. Negative for wound.  Neurological:  Negative for weakness and numbness.     Physical Exam Triage Vital Signs ED Triage Vitals  Encounter Vitals Group     BP      Systolic BP Percentile      Diastolic BP Percentile      Pulse      Resp      Temp      Temp src  SpO2      Weight      Height      Head Circumference      Peak Flow      Pain Score      Pain Loc      Pain Education      Exclude from Growth Chart    No data found.  Updated Vital Signs BP 126/83 (BP Location: Left Arm)   Pulse 89   Temp 98.7 F (37.1 C) (Oral)   Resp 14   Ht 5\' 5"  (1.651 m)   Wt 235 lb 0.2 oz (106.6 kg)   LMP 06/27/2020   SpO2 98%   BMI 39.11 kg/m      Physical Exam Vitals and nursing note reviewed.  Constitutional:      General: She is not in acute distress.    Appearance: Normal appearance. She is not ill-appearing or toxic-appearing.  HENT:     Head: Normocephalic and atraumatic.  Eyes:     General: No scleral icterus.       Right eye: No discharge.        Left eye: No discharge.     Conjunctiva/sclera: Conjunctivae normal.  Cardiovascular:     Rate and Rhythm: Normal rate and regular rhythm.  Pulmonary:     Effort: Pulmonary effort is normal. No respiratory distress.  Musculoskeletal:     Cervical back: Neck supple.  Skin:    General: Skin is dry.     Comments: LEFT GREAT TOE: There is mild swelling and erythema of the medial nail fold. There is a wedge cut out of the medial nail. No obvious ingrowing nail to the remainder of the nail. Area is minimally tender. No drainage.  Neurological:     General: No focal deficit present.     Mental Status: She is alert. Mental status is at baseline.     Motor: No weakness.     Gait: Gait normal.  Psychiatric:        Mood and Affect: Mood normal.        Behavior: Behavior normal.      UC Treatments / Results   Labs (all labs ordered are listed, but only abnormal results are displayed) Labs Reviewed - No data to display  EKG   Radiology No results found.  Procedures Procedures (including critical care time)  Medications Ordered in UC Medications - No data to display  Initial Impression / Assessment and Plan / UC Course  I have reviewed the triage vital signs and the nursing notes.  Pertinent labs & imaging results that were available during my care of the patient were reviewed by me and considered in my medical decision making (see chart for details).   37 year old female presents for concerns of an ingrown toenail of the left great toe.  She had concern for couple months ago and saw podiatrist but they did not believe there was an ingrown toenail.  She made an appointment for next week again for her concerns.  Over the past 5 days she has had increased discomfort.  She currently rates her discomfort at 1 out of 10.  She has been soaking the toe and using Neosporin.  Vitals are all normal and stable.  On exam she has mild swelling and erythema of the medial nail fold.  There is a wedge missing of the medial nail and no obvious ingrowing nail noted.  The area is mildly tender to palpation.  Exam not consistent with obvious ingrown  toenail.  Slight early cellulitis noted.  Advised to treat the early infection with antibiotics and continue soaks and follow-up with podiatry as scheduled in a few days.  Sent doxycycline and mupirocin ointment to pharmacy.  She may return here sooner if anything acutely worsens.   Final Clinical Impressions(s) / UC Diagnoses   Final diagnoses:  Pain around toenail, left foot  Cellulitis of toe of left foot     Discharge Instructions      -I am unable to see any area of ingrown toenail and a large portion of the nail has already  been removed -Continue with soaks and begin using Mupirocin ointment -I sent an antibiotic for developing skin  infection -Follow up with podiatrist in a few days as scheduled.      ED Prescriptions     Medication Sig Dispense Auth. Provider   doxycycline (VIBRAMYCIN) 100 MG capsule Take 1 capsule (100 mg total) by mouth 2 (two) times daily for 7 days. 14 capsule Eusebio Friendly B, PA-C   mupirocin ointment (BACTROBAN) 2 % Apply 1 Application topically 2 (two) times daily. 22 g Shirlee Latch, PA-C      PDMP not reviewed this encounter.   Shirlee Latch, PA-C 09/25/23 [phone]

## 2024-09-01 ENCOUNTER — Other Ambulatory Visit: Payer: Self-pay | Admitting: Physician Assistant

## 2024-09-01 DIAGNOSIS — Z1231 Encounter for screening mammogram for malignant neoplasm of breast: Secondary | ICD-10-CM

## 2024-09-12 ENCOUNTER — Other Ambulatory Visit: Payer: Self-pay | Admitting: Surgery

## 2024-09-14 ENCOUNTER — Encounter
Admission: RE | Admit: 2024-09-14 | Discharge: 2024-09-14 | Disposition: A | Discharge status: Home / Self Care | Source: Ambulatory Visit | Attending: Surgery | Admitting: Surgery

## 2024-09-14 ENCOUNTER — Other Ambulatory Visit: Payer: Self-pay

## 2024-09-14 NOTE — Patient Instructions (Addendum)
 Your procedure is scheduled on: [facility] 09/21/24 Report to the Registration Desk on the 1st floor of the Medical Mall. To find out your arrival time, please call [phone] between 1PM - 3PM on: TUESDAY 09/20/24 If your arrival time is 6:00 am, do not arrive before that time as the Medical Mall entrance doors do not open until 6:00 am.  REMEMBER: Instructions that are not followed completely may result in serious medical risk, up to and including death; or upon the discretion of your surgeon and anesthesiologist your surgery may need to be rescheduled.  Do not eat food after midnight the night before surgery.  No gum chewing or hard candies.  You may however, drink CLEAR liquids up to 2 hours before you are scheduled to arrive for your surgery. Do not drink anything within 2 hours of your scheduled arrival time.  Clear liquids include: - water  - apple juice without pulp - gatorade (not RED colors) - black coffee or tea (Do NOT add milk or creamers to the coffee or tea) Do NOT drink anything that is not on this list.  In addition, your doctor has ordered for you to drink the provided:  Ensure Pre-Surgery Clear Carbohydrate Drink  Drinking this carbohydrate drink up to two hours before surgery helps to reduce insulin resistance and improve patient outcomes. Please complete drinking 2 hours before scheduled arrival time.  One week prior to surgery: Stop Anti-inflammatories (NSAIDS) such as tiZANidine (ZANAFLEX) Advil , Aleve, Ibuprofen , Motrin , Naproxen, Naprosyn and Aspirin based products such as Excedrin, Goody's Powder, BC Powder.  Stop ANY OVER THE COUNTER supplements until after surgery.  STOP metFORMIN (GLUCOPHAGE-XR) 2 days before surgery. Last dose 09/18/24  You may however, continue to take Tylenol  if needed for pain up until the day of surgery.  Continue taking all of your other prescription medications up until the day of surgery.  ON THE DAY OF SURGERY ONLY TAKE THESE  MEDICATIONS WITH SIPS OF WATER:  amLODipine (NORVASC)  buPROPion (WELLBUTRIN XL)  busPIRone (BUSPAR)  famotidine (PEPCID) labetalol (NORMODYNE)  omeprazole (PRILOSEC)  rosuvastatin (CRESTOR)  venlafaxine XR (EFFEXOR-XR)   Use inhalers on the day of surgery and bring to the hospital.  No Alcohol for 24 hours before or after surgery.  No Smoking including e-cigarettes for 24 hours before surgery.  No chewable tobacco products for at least 6 hours before surgery.  No nicotine patches on the day of surgery.  Do not use any recreational drugs for at least a week (preferably 2 weeks) before your surgery.  Please be advised that the combination of cocaine and anesthesia may have negative outcomes, up to and including death. If you test positive for cocaine, your surgery will be cancelled.  On the morning of surgery brush your teeth with toothpaste and water, you may rinse your mouth with mouthwash if you wish. Do not swallow any toothpaste or mouthwash.  Use CHG Soap or wipes as directed on instruction sheet.  Do not wear jewelry, make-up, hairpins, clips or nail polish.  For welded (permanent) jewelry: bracelets, anklets, waist bands, etc.  Please have this removed prior to surgery.  If it is not removed, there is a chance that hospital personnel will need to cut it off on the day of surgery.  Do not wear lotions, powders, or perfumes.   Do not shave body hair from the neck down 48 hours before surgery.  Contact lenses, hearing aids and dentures may not be worn into surgery.  Do not bring  valuables to the hospital. [facility] is not responsible for any missing/lost belongings or valuables.   Bring your C-PAP to the hospital in case you may have to spend the night.   Notify your doctor if there is any change in your medical condition (cold, fever, infection).  Wear comfortable clothing (specific to your surgery type) to the hospital.  After surgery, you can help prevent lung  complications by doing breathing exercises.  Take deep breaths and cough every 1-2 hours. Your doctor may order a device called an Incentive Spirometer to help you take deep breaths.  If you are being discharged the day of surgery, you will not be allowed to drive home. You will need a responsible individual to drive you home and stay with you for 24 hours after surgery.   If you are taking public transportation, you will need to have a responsible individual with you.  Please call the Pre-admissions Testing Dept. at [phone] if you have any questions about these instructions.  Surgery Visitation Policy:  Patients having surgery or a procedure may have two visitors.  Children under the age of 58 must have an adult with them who is not the patient.  Merchandiser, Retail to address health-related social needs:  [email or web address]                                                                                                             Preparing for Surgery with CHLORHEXIDINE  GLUCONATE (CHG) Soap  Chlorhexidine  Gluconate (CHG) Soap  o An antiseptic cleaner that kills germs and bonds with the skin to continue killing germs even after washing  o Used for showering the night before surgery and morning of surgery  Before surgery, you can play an important role by reducing the number of germs on your skin.  CHG (Chlorhexidine  gluconate) soap is an antiseptic cleanser which kills germs and bonds with the skin to continue killing germs even after washing.  Please do not use if you have an allergy to CHG or antibacterial soaps. If your skin becomes reddened/irritated stop using the CHG.  1. Shower the NIGHT BEFORE SURGERY with CHG soap.  2. If you choose to wash your hair, wash your hair first as usual with your normal shampoo.  3. After shampooing, rinse your hair and body thoroughly to remove the shampoo.  4. Use CHG as you would any other liquid soap. You  can apply CHG directly to the skin and wash gently with a clean washcloth.  5. Apply the CHG soap to your body only from the neck down. Do not use on open wounds or open sores. Avoid contact with your eyes, ears, mouth, and genitals (private parts). Wash face and genitals (private parts) with your normal soap.  6. Wash thoroughly, paying special attention to the area where your surgery will be performed.  7. Thoroughly rinse your body with warm water.  8. Do not shower/wash with your normal soap after using and rinsing off the CHG soap.  9. Do not use lotions, oils,  etc., after showering with CHG.  10. Pat yourself dry with a clean towel.  11. Wear clean pajamas to bed the night before surgery.  12. Place clean sheets on your bed the night of your shower and do not sleep with pets.  13. Do not apply any deodorants/lotions/powders.  14. Please wear clean clothes to the hospital.  15. Remember to brush your teeth with your regular toothpaste.

## 2024-09-15 ENCOUNTER — Other Ambulatory Visit: Payer: Self-pay | Admitting: Medical Genetics

## 2024-09-16 ENCOUNTER — Encounter: Payer: Self-pay | Admitting: Urgent Care

## 2024-09-19 ENCOUNTER — Encounter
Admission: RE | Admit: 2024-09-19 | Discharge: 2024-09-19 | Disposition: A | Source: Ambulatory Visit | Attending: Surgery

## 2024-09-19 DIAGNOSIS — I10 Essential (primary) hypertension: Secondary | ICD-10-CM | POA: Diagnosis not present

## 2024-09-19 DIAGNOSIS — Z01810 Encounter for preprocedural cardiovascular examination: Secondary | ICD-10-CM | POA: Diagnosis not present

## 2024-09-19 DIAGNOSIS — G5600 Carpal tunnel syndrome, unspecified upper limb: Secondary | ICD-10-CM | POA: Insufficient documentation

## 2024-09-20 ENCOUNTER — Encounter

## 2024-09-20 MED ORDER — CEFAZOLIN SODIUM-DEXTROSE 2-4 GM/100ML-% IV SOLN
2.0000 g | INTRAVENOUS | Status: AC
  Administered 2024-09-21: 2 g via INTRAVENOUS

## 2024-09-21 ENCOUNTER — Ambulatory Visit: Admission: RE | Admit: 2024-09-21 | Discharge: 2024-09-21 | Disposition: A | Attending: Surgery | Admitting: Surgery

## 2024-09-21 ENCOUNTER — Other Ambulatory Visit: Payer: Self-pay

## 2024-09-21 ENCOUNTER — Ambulatory Visit: Payer: Self-pay | Admitting: Urgent Care

## 2024-09-21 ENCOUNTER — Encounter
Admission: RE | Disposition: A | Discharge status: Home / Self Care | Payer: Self-pay | Source: Home / Self Care | Attending: Surgery

## 2024-09-21 ENCOUNTER — Encounter: Payer: Self-pay | Admitting: Surgery

## 2024-09-21 DIAGNOSIS — Z01810 Encounter for preprocedural cardiovascular examination: Secondary | ICD-10-CM

## 2024-09-21 DIAGNOSIS — G5600 Carpal tunnel syndrome, unspecified upper limb: Secondary | ICD-10-CM

## 2024-09-21 HISTORY — PX: CARPAL TUNNEL RELEASE: SHX101

## 2024-09-21 SURGERY — RELEASE, CARPAL TUNNEL, ENDOSCOPIC
Anesthesia: General | Site: Wrist | Laterality: Right

## 2024-09-21 MED ORDER — METOCLOPRAMIDE HCL 10 MG PO TABS: 5.0000 mg | ORAL_TABLET | Freq: Three times a day (TID) | ORAL | Status: DC | PRN

## 2024-09-21 MED ORDER — DROPERIDOL 2.5 MG/ML IJ SOLN: 0.6250 mg | Freq: Once | INTRAMUSCULAR | Status: DC | PRN

## 2024-09-21 MED ORDER — SCOPOLAMINE 1 MG/3DAYS TD PT72
1.0000 | MEDICATED_PATCH | TRANSDERMAL | Status: DC
  Administered 2024-09-21: 1 via TRANSDERMAL

## 2024-09-21 MED ORDER — SODIUM CHLORIDE 0.9 % IV SOLN: INTRAVENOUS | Status: DC

## 2024-09-21 MED ORDER — DEXMEDETOMIDINE HCL IN NACL 80 MCG/20ML IV SOLN
INTRAVENOUS | Status: DC | PRN
  Administered 2024-09-21: 8 ug via INTRAVENOUS
  Administered 2024-09-21: 4 ug via INTRAVENOUS

## 2024-09-21 MED ORDER — OXYCODONE HCL 5 MG/5ML PO SOLN: 5.0000 mg | Freq: Once | ORAL | Status: AC | PRN

## 2024-09-21 MED ORDER — LIDOCAINE HCL (CARDIAC) PF 100 MG/5ML IV SOSY
PREFILLED_SYRINGE | INTRAVENOUS | Status: DC | PRN
  Administered 2024-09-21: 100 mg via INTRAVENOUS

## 2024-09-21 MED ORDER — ONDANSETRON HCL 4 MG/2ML IJ SOLN
INTRAMUSCULAR | Status: DC | PRN
  Administered 2024-09-21: 4 mg via INTRAVENOUS

## 2024-09-21 MED ORDER — PROPOFOL 1000 MG/100ML IV EMUL
INTRAVENOUS | Status: AC
  Filled 2024-09-21: qty 100

## 2024-09-21 MED ORDER — ONDANSETRON HCL 4 MG/2ML IJ SOLN: 4.0000 mg | Freq: Four times a day (QID) | INTRAMUSCULAR | Status: DC | PRN

## 2024-09-21 MED ORDER — DEXAMETHASONE SOD PHOSPHATE PF 10 MG/ML IJ SOLN
INTRAMUSCULAR | Status: DC | PRN
  Administered 2024-09-21: 10 mg via INTRAVENOUS

## 2024-09-21 MED ORDER — CHLORHEXIDINE GLUCONATE 0.12 % MT SOLN
OROMUCOSAL | Status: AC
  Filled 2024-09-21: qty 15

## 2024-09-21 MED ORDER — OXYCODONE HCL 5 MG PO TABS
5.0000 mg | ORAL_TABLET | Freq: Once | ORAL | Status: AC | PRN
  Administered 2024-09-21: 5 mg via ORAL

## 2024-09-21 MED ORDER — PHENYLEPHRINE 80 MCG/ML (10ML) SYRINGE FOR IV PUSH (FOR BLOOD PRESSURE SUPPORT)
PREFILLED_SYRINGE | INTRAVENOUS | Status: DC | PRN
  Administered 2024-09-21: 80 ug via INTRAVENOUS

## 2024-09-21 MED ORDER — FENTANYL CITRATE (PF) 100 MCG/2ML IJ SOLN
INTRAMUSCULAR | Status: AC
  Filled 2024-09-21: qty 2

## 2024-09-21 MED ORDER — MIDAZOLAM HCL (PF) 2 MG/2ML IJ SOLN
INTRAMUSCULAR | Status: DC | PRN
  Administered 2024-09-21: 2 mg via INTRAVENOUS

## 2024-09-21 MED ORDER — ACETAMINOPHEN 325 MG PO TABS: 325.0000 mg | ORAL_TABLET | Freq: Four times a day (QID) | ORAL | Status: DC | PRN

## 2024-09-21 MED ORDER — KETOROLAC TROMETHAMINE 30 MG/ML IJ SOLN
30.0000 mg | Freq: Once | INTRAMUSCULAR | Status: AC
  Administered 2024-09-21: 30 mg via INTRAVENOUS

## 2024-09-21 MED ORDER — MIDAZOLAM HCL 2 MG/2ML IJ SOLN
INTRAMUSCULAR | Status: AC
  Filled 2024-09-21: qty 2

## 2024-09-21 MED ORDER — ACETAMINOPHEN 10 MG/ML IV SOLN
INTRAVENOUS | Status: DC | PRN
  Administered 2024-09-21: 1000 mg via INTRAVENOUS

## 2024-09-21 MED ORDER — 0.9 % SODIUM CHLORIDE (POUR BTL) OPTIME
TOPICAL | Status: DC | PRN
  Administered 2024-09-21: 500 mL

## 2024-09-21 MED ORDER — CEFAZOLIN SODIUM-DEXTROSE 2-4 GM/100ML-% IV SOLN
INTRAVENOUS | Status: AC
  Filled 2024-09-21: qty 100

## 2024-09-21 MED ORDER — ACETAMINOPHEN 10 MG/ML IV SOLN: 1000.0000 mg | Freq: Once | INTRAVENOUS | Status: DC | PRN

## 2024-09-21 MED ORDER — OXYCODONE HCL 5 MG PO TABS
ORAL_TABLET | ORAL | Status: AC
  Filled 2024-09-21: qty 1

## 2024-09-21 MED ORDER — KETOROLAC TROMETHAMINE 30 MG/ML IJ SOLN
INTRAMUSCULAR | Status: AC
  Filled 2024-09-21: qty 1

## 2024-09-21 MED ORDER — LACTATED RINGERS IV SOLN: INTRAVENOUS | Status: DC

## 2024-09-21 MED ORDER — PROPOFOL 10 MG/ML IV BOLUS
INTRAVENOUS | Status: AC
  Filled 2024-09-21: qty 40

## 2024-09-21 MED ORDER — SCOPOLAMINE 1 MG/3DAYS TD PT72
MEDICATED_PATCH | TRANSDERMAL | Status: AC
  Filled 2024-09-21: qty 1

## 2024-09-21 MED ORDER — PROPOFOL 10 MG/ML IV BOLUS
INTRAVENOUS | Status: AC
  Filled 2024-09-21: qty 60

## 2024-09-21 MED ORDER — METOCLOPRAMIDE HCL 5 MG/ML IJ SOLN: 5.0000 mg | Freq: Three times a day (TID) | INTRAMUSCULAR | Status: DC | PRN

## 2024-09-21 MED ORDER — ONDANSETRON HCL 4 MG PO TABS: 4.0000 mg | ORAL_TABLET | Freq: Four times a day (QID) | ORAL | Status: DC | PRN

## 2024-09-21 MED ORDER — ACETAMINOPHEN 10 MG/ML IV SOLN
INTRAVENOUS | Status: AC
  Filled 2024-09-21: qty 100

## 2024-09-21 MED ORDER — PROPOFOL 500 MG/50ML IV EMUL
INTRAVENOUS | Status: DC | PRN
  Administered 2024-09-21: 125 ug/kg/min via INTRAVENOUS

## 2024-09-21 MED ORDER — FENTANYL CITRATE (PF) 100 MCG/2ML IJ SOLN: 25.0000 ug | INTRAMUSCULAR | Status: DC | PRN

## 2024-09-21 MED ORDER — PROPOFOL 10 MG/ML IV BOLUS
INTRAVENOUS | Status: DC | PRN
  Administered 2024-09-21: 50 mg via INTRAVENOUS
  Administered 2024-09-21: 200 mg via INTRAVENOUS
  Administered 2024-09-21: 100 mg via INTRAVENOUS
  Administered 2024-09-21: 50 mg via INTRAVENOUS

## 2024-09-21 MED ORDER — ONDANSETRON HCL 4 MG/2ML IJ SOLN
INTRAMUSCULAR | Status: AC
  Filled 2024-09-21: qty 2

## 2024-09-21 MED ORDER — BUPIVACAINE HCL (PF) 0.5 % IJ SOLN
INTRAMUSCULAR | Status: DC | PRN
  Administered 2024-09-21: 10 mL

## 2024-09-21 MED ORDER — FENTANYL CITRATE (PF) 100 MCG/2ML IJ SOLN
INTRAMUSCULAR | Status: DC | PRN
  Administered 2024-09-21 (×3): 50 ug via INTRAVENOUS

## 2024-09-21 MED ORDER — CHLORHEXIDINE GLUCONATE 0.12 % MT SOLN
15.0000 mL | Freq: Once | OROMUCOSAL | Status: AC
  Administered 2024-09-21: 15 mL via OROMUCOSAL

## 2024-09-21 MED ORDER — ORAL CARE MOUTH RINSE: 15.0000 mL | Freq: Once | OROMUCOSAL | Status: AC

## 2024-09-21 MED ORDER — HYDROCODONE-ACETAMINOPHEN 5-325 MG PO TABS: 1.0000 | ORAL_TABLET | ORAL | Status: DC | PRN

## 2024-09-21 MED ORDER — BUPIVACAINE HCL (PF) 0.5 % IJ SOLN
INTRAMUSCULAR | Status: AC
  Filled 2024-09-21: qty 30

## 2024-09-21 SURGICAL SUPPLY — 30 items
BNDG COHESIVE 4X5 TAN STRL LF (GAUZE/BANDAGES/DRESSINGS) ×1 IMPLANT
BNDG ELASTIC 2INX 5YD STR LF (GAUZE/BANDAGES/DRESSINGS) ×1 IMPLANT
BNDG ESMARCH 4X12 STRL LF (GAUZE/BANDAGES/DRESSINGS) ×1 IMPLANT
CHLORAPREP W/TINT 26 (MISCELLANEOUS) ×1 IMPLANT
CORD BIP STRL DISP 12FT (MISCELLANEOUS) ×1 IMPLANT
CUFF TOURN SGL QUICK 18X4 (TOURNIQUET CUFF) ×1 IMPLANT
DRAPE SURG 17X11 SM STRL (DRAPES) ×1 IMPLANT
FORCEPS JEWEL BIP 4-3/4 STR (INSTRUMENTS) ×1 IMPLANT
GAUZE SPONGE 4X4 12PLY STRL (GAUZE/BANDAGES/DRESSINGS) ×1 IMPLANT
GAUZE XEROFORM 1X8 LF (GAUZE/BANDAGES/DRESSINGS) ×1 IMPLANT
GLOVE BIO SURGEON STRL SZ8 (GLOVE) ×1 IMPLANT
GLOVE BIOGEL PI IND STRL 8 (GLOVE) ×1 IMPLANT
GOWN STRL REUS W/ TWL LRG LVL3 (GOWN DISPOSABLE) ×1 IMPLANT
GOWN STRL REUS W/ TWL XL LVL3 (GOWN DISPOSABLE) ×1 IMPLANT
KIT ESCP INSRT D SLOT CANN KN (MISCELLANEOUS) ×1 IMPLANT
KIT TURNOVER KIT A (KITS) ×1 IMPLANT
MANIFOLD NEPTUNE II (INSTRUMENTS) ×1 IMPLANT
NS IRRIG 500ML POUR BTL (IV SOLUTION) ×1 IMPLANT
PACK EXTREMITY ARMC (MISCELLANEOUS) ×1 IMPLANT
PENCIL SMOKE EVACUATOR (MISCELLANEOUS) ×1 IMPLANT
SOLN STERILE WATER 500 ML (IV SOLUTION) IMPLANT
SPLINT LT WRIST LG 8.5 (SOFTGOODS) IMPLANT
SPLINT LT WRIST MD 8 (SOFTGOODS) IMPLANT
SPLINT LT WRIST XL 8.5+ LP LCK (SOFTGOODS) IMPLANT
SPLINT RT WRIST LG 8.5 (SOFTGOODS) IMPLANT
SPLINT RT WRIST MD 8 (SOFTGOODS) IMPLANT
SPLINT RT WRIST XL 8.5+ (SOFTGOODS) IMPLANT
STOCKINETTE IMPERVIOUS 9X36 MD (GAUZE/BANDAGES/DRESSINGS) ×1 IMPLANT
SUT PROLENE 4 0 PS 2 18 (SUTURE) ×1 IMPLANT
TRAP FLUID SMOKE EVACUATOR (MISCELLANEOUS) IMPLANT

## 2024-09-21 NOTE — Transfer of Care (Signed)
 Immediate Anesthesia Transfer of Care Note  Patient: Sandra Sanchez  Procedure(s) Performed: RELEASE, CARPAL TUNNEL, ENDOSCOPIC (Right: Wrist)  Patient Location: PACU  Anesthesia Type:General  Level of Consciousness: awake, alert , oriented, and patient cooperative  Airway & Oxygen Therapy: Patient Spontanous Breathing and Patient connected to nasal cannula oxygen  Post-op Assessment: Report given to RN and Post -op Vital signs reviewed and stable  Post vital signs: Reviewed and stable  Last Vitals:  Vitals Value Taken Time  BP 111/78 09/21/24 14:49  Temp    Pulse 89 09/21/24 14:52  Resp 21 09/21/24 14:52  SpO2 96 % 09/21/24 14:52  Vitals shown include unfiled device data.  Last Pain:  Vitals:   09/21/24 1218  TempSrc: Temporal  PainSc: 2          Complications: No notable events documented.

## 2024-09-21 NOTE — Discharge Instructions (Addendum)
 Orthopedic discharge instructions: Keep dressing dry and intact. Keep hand elevated above heart level. May shower after dressing removed on postop day 4 (Sunday). Cover sutures with Band-Aids after drying off, then reapply Velcro splint. Apply ice to affected area frequently. Take ibuprofen 600-800 mg TID with meals for 3-5 days, then as necessary. Take ES Tylenol  or pain medication as prescribed when needed.  Return for follow-up in 10-14 days or as scheduled.

## 2024-09-21 NOTE — Anesthesia Preprocedure Evaluation (Addendum)
 Anesthesia Evaluation  Patient identified by MRN, date of birth, ID band Patient awake    Reviewed: Allergy & Precautions, H&P , NPO status , Patient's Chart, lab work & pertinent test results  Airway Mallampati: II  TM Distance: >3 FB Neck ROM: full    Dental  (+) Missing,    Pulmonary shortness of breath and with exertion, former smoker   Pulmonary exam normal        Cardiovascular hypertension, Normal cardiovascular exam     Neuro/Psych  PSYCHIATRIC DISORDERS Anxiety     negative neurological ROS     GI/Hepatic Neg liver ROS,GERD  Controlled,,  Endo/Other  Hypothyroidism  Class 3 obesity  Renal/GU      Musculoskeletal   Abdominal  (+) + obese  Peds  Hematology  (+) Blood dyscrasia, anemia   Anesthesia Other Findings Past Medical History: No date: Anemia No date: Anxiety No date: Depression No date: GERD (gastroesophageal reflux disease)     Comment:  RARE No date: High cholesterol No date: Hypertension No date: Hypothyroidism     Comment:  H/O WITH PREGNANCY ONLY No date: Obesity  Past Surgical History: No date: ABDOMINAL HYSTERECTOMY 07/20/2020: CYSTOSCOPY     Comment:  Procedure: CYSTOSCOPY;  Surgeon: Lovetta Debby PARAS,              MD;  Location: [facility];  Service: Gynecology;; 07/20/2020: EXCISION OF ENDOMETRIOMA     Comment:  Procedure: EXCISION OF ENDOMETRIOMA;  Surgeon:               Lovetta Debby PARAS, MD;  Location: [facility];                Service: Gynecology;;  excision of endometriosis 10/26/2018: HYSTEROSCOPY WITH D & C; N/A     Comment:  Procedure: DILATATION AND CURETTAGE /HYSTEROSCOPY,               ENDOMETRIAL ABLATION WITH NOVASURE;  Surgeon: Victor Claudell SAUNDERS, MD;  Location: [facility];  Service:               Gynecology;  Laterality: N/A; 07/20/2020: LAPAROSCOPIC ASSISTED VAGINAL HYSTERECTOMY; N/A     Comment:  Procedure: LAPAROSCOPIC ASSISTED VAGINAL  HYSTERECTOMY;                Surgeon: Schermerhorn, Debby PARAS, MD;  Location: [facility];              Service: Gynecology;  Laterality: N/A; 10/26/2018: LAPAROSCOPIC BILATERAL SALPINGECTOMY; Bilateral     Comment:  Procedure: LAPAROSCOPIC BILATERAL SALPINGECTOMY;                Surgeon: Victor Claudell SAUNDERS, MD;  Location: [facility];               Service: Gynecology;  Laterality: Bilateral; No date: MANDIBLE FRACTURE SURGERY     Reproductive/Obstetrics negative OB ROS                              Anesthesia Physical Anesthesia Plan  ASA: 3  Anesthesia Plan: General   Post-op Pain Management: Ofirmev  IV (intra-op)*   Induction: Intravenous  PONV Risk Score and Plan: Dexamethasone , Ondansetron , Midazolam , Treatment may vary due to age or medical condition, Propofol  infusion, TIVA and Scopolamine  patch - Pre-op  Airway Management Planned: LMA  Additional Equipment:   Intra-op Plan:   Post-operative  Plan: Extubation in OR  Informed Consent: I have reviewed the patients History and Physical, chart, labs and discussed the procedure including the risks, benefits and alternatives for the proposed anesthesia with the patient or authorized representative who has indicated his/her understanding and acceptance.     Dental Advisory Given  Plan Discussed with: Anesthesiologist, CRNA and Surgeon  Anesthesia Plan Comments:          Anesthesia Quick Evaluation

## 2024-09-21 NOTE — Op Note (Signed)
 09/21/2024  2:48 PM  Patient:   Sandra Sanchez  Pre-Op Diagnosis:   Right carpal tunnel syndrome.  Post-Op Diagnosis:   Same.  Procedure:   Endoscopic right carpal tunnel release.  Surgeon:   DOROTHA Reyes Maltos, MD  Anesthesia:   General LMA  Findings:   As above.  Complications:   None  EBL:   0 cc  Fluids:   400 cc crystalloid  TT:   16 minutes at 250 mmHg  Drains:   None  Closure:   4-0 Prolene interrupted sutures  Brief Clinical Note:   The patient is a 38 year old female with a history of progressive worsening pain and paresthesias to her right hand. Her symptoms have progressed despite medications, activity modification, etc. Her history and examination consistent with carpal tunnel syndrome. The patient presents at this time for an endoscopic right carpal tunnel release.   Procedure:   The patient was brought into the operating room and lain in the supine position. After adequate general laryngeal mask anesthesia was obtained, the right hand and upper extremity were prepped with ChloraPrep solution before being draped sterilely. Preoperative antibiotics were administered. A timeout was performed to verify the appropriate surgical site before the limb was exsanguinated with an Esmarch and the tourniquet inflated to 250 mmHg.   An approximately 1.5-2 cm incision was made over the volar wrist flexion crease, centered over the palmaris longus tendon. The incision was carried down through the subcutaneous tissues with care taken to identify and protect any neurovascular structures. The distal forearm fascia was penetrated just proximal to the transverse carpal ligament. The soft tissues were released off the superficial and deep surfaces of the distal forearm fascia and this was released proximally for 3-4 cm under direct visualization.  Attention was directed distally. The Therapist, nutritional was passed beneath the transverse carpal ligament along the ulnar aspect of the carpal tunnel  and used to release any adhesions as well as to remove any adherent synovial tissue before first the smaller then the larger of the two dilators were passed beneath the transverse carpal ligament along the ulnar margin of the carpal tunnel. The slotted cannula was introduced and the endoscope was placed into the slotted cannula and the undersurface of the transverse carpal ligament visualized. The distal margin of the transverse carpal ligament was marked by placing a 25-gauge needle percutaneously at Kaplan's cardinal point so that it entered the distal portion of the slotted cannula. Under endoscopic visualization, the transverse carpal ligament was released from proximal to distal using the end-cutting blade. A second pass was performed to ensure complete release of the ligament. The adequacy of release was verified both endoscopically and by palpation using the freer elevator.  The wound was irrigated thoroughly with sterile saline solution before being closed using 4-0 Prolene interrupted sutures. A total of 10 cc of 0.5% plain Sensorcaine  was injected in and around the incision before a sterile bulky dressing was applied to the wound. The patient was placed into a volar wrist splint before being awakened, extubated, and returned to the recovery room in satisfactory condition after tolerating the procedure well.

## 2024-09-21 NOTE — H&P (Signed)
 History of Present Illness: Sandra Sanchez is a 38 y.o. female who presents today for her surgical history and physical for upcoming right endoscopic carpal tunnel release procedure. Surgery is scheduled with Dr. Edie 09/21/2024. The patient denies any changes in her medical history since her last evaluation. She denies any trauma or injury affecting the right wrist since her last evaluation. She denies any personal history of heart attack, stroke, asthma or COPD. No personal history of blood clots. She is not diabetic. She reports continued burning and tingling in the right wrist. She has undergone injections with minimal relief of her discomfort. She does experience burning and tingling in bilateral upper extremities.  Past Medical History: Abnormal cytology 2014  Ascus  Depression 2010  Endometriosis 2020  s/p ablation heavy bleeding; endometriosis (R ovary). s/p BTL.  Hyperlipidemia 2016  Hypertensive disorder 03/05/2019  Migraine headache 01/04/2015  Morbid obesity (CMS-HCC) 01/04/2015  Obstructive sleep apnea (adult) (pediatric) 03/12/2021  Vitamin D insufficiency 01/04/2015   Past Surgical History: TUBAL LIGATION 10/2018 dt endometriosis [facility]  ENDOMETRIAL ABLATION 10/2018 with Lap b/l salpingectomy  LAVH right oophorectomy excision of sidewall Endometriosis 07/20/2020  reconstruction of jaw - 10/21/1999   Past Family History: Coronary Artery Disease (Blocked arteries around heart) Mother  High blood pressure (Hypertension) Mother  Myocardial Infarction (Heart attack) Mother  Cancer Mother 80  large cell neuroendocrine cell carcinoma  Alcohol abuse Father  Coronary Artery Disease (Blocked arteries around heart) Father  Myocardial Infarction (Heart attack) Father  Anxiety Sister  Hyperlipidemia (Elevated cholesterol) Sister  Diabetes Maternal Grandmother  Stroke Maternal Grandmother  Myocardial Infarction (Heart attack) Maternal Grandfather  Stroke Paternal Grandmother   Diabetes Paternal Grandmother  Breast cancer Paternal Grandmother  Thyroid disease Paternal Aunt  Throat cancer Maternal Aunt  Alzheimer's disease Maternal Aunt   Medications: amLODIPine (NORVASC) 2.5 MG tablet Take 1 tablet (2.5 mg total) by mouth 2 (two) times daily 180 tablet 3  buPROPion (WELLBUTRIN XL) 300 MG XL tablet Take 1 tablet (300 mg total) by mouth once daily 90 tablet 3  busPIRone (BUSPAR) 10 MG tablet Take 1 tablet (10 mg total) by mouth 2 (two) times daily 60 tablet 9  cephalexin (KEFLEX) 500 MG capsule Take 4 pills one hour prior to office surgery. Then four times a day. 12 capsule 0  cholecalciferol (VITAMIN D3) 2,000 unit capsule Take 1 capsule (2,000 Units total) by mouth once daily  COLLAGEN MISC Use  famotidine (PEPCID) 40 MG tablet Take 1 tablet by mouth at bedtime. 100 tablet 3  hydroCHLOROthiazide (HYDRODIURIL) 25 MG tablet Take 1 tablet (25 mg total) by mouth once daily 90 tablet 3  ibuprofen  (MOTRIN ) 200 MG tablet Take 200 mg by mouth as needed for Pain  labetaloL (TRANDATE) 200 MG tablet Take 2 tablets by mouth daily. 180 tablet 3  magnesium oxide (MAG-OX) 400 mg (241.3 mg magnesium) tablet Take by mouth  metFORMIN (GLUCOPHAGE-XR) 500 MG XR tablet Take 2 tablets (1,000 mg total) by mouth 2 (two) times daily with meals 360 tablet 3  omeprazole (PRILOSEC) 40 MG DR capsule Take 1 capsule (40 mg total) by mouth once daily 90 capsule 3  potassium chloride (KLOR-CON M10) 10 mEq ER tablet Take 2 tablets (20 mEq total) by mouth once daily 90 tablet 3  rosuvastatin (CRESTOR) 5 MG tablet Take 1 tablet (5 mg total) by mouth once daily 90 tablet 3  tirzepatide (ZEPBOUND) 5 mg/0.5 mL injection Inject 0.5 mLs (5 mg total) subcutaneously every 7 (seven) days 2 mL 11  tiZANidine (ZANAFLEX) 4 MG tablet TAKE 1 TABLET BY MOUTH 2 TIMES DAILY AS NEEDED. 60 tablet 8  tretinoin (RETIN-A) 0.025 % cream Apply 0.025 % topically at bedtime  venlafaxine (EFFEXOR-XR) 150 MG XR capsule Take  1 capsule (150 mg total) by mouth once daily 90 capsule 3   Allergies: Atorvastatin Muscle Pain  Clindamycin Hives   Review of Systems:  A comprehensive 14 point ROS was performed, reviewed by me today, and the pertinent orthopaedic findings are documented in the HPI.  Physical Exam: Ht 165.1 cm (5' 5)  LMP 01/30/2020  BMI 48.59 kg/m  General/Constitutional: The patient appears to be well-nourished, well-developed, and in no acute distress. Neuro/Psych: Normal mood and affect, oriented to person, place and time. Eyes: Non-icteric. Pupils are equal, round, and reactive to light, and exhibit synchronous movement. ENT: Unremarkable. Lymphatic: No palpable adenopathy. Respiratory: Lungs clear to auscultation, Normal chest excursion, No wheezes, and Non-labored breathing Cardiovascular: Regular rate and rhythm. No murmurs. and No edema, swelling or tenderness, except as noted in detailed exam. Integumentary: No impressive skin lesions present, except as noted in detailed exam. Musculoskeletal: Unremarkable, except as noted in detailed exam.  Neck: Neck has full range of motion. There is no tenderness to palpation. Spurling's test is negative.   Right Upper Extremity: Normal shoulder contour. Good active and passive range of motion and stability of the shoulder, elbow, and wrist. Normal motion of the hand and digits. No swelling, erythema, or ecchymosis is noted. There is no triggering or locking of the digits noted. Mild thenar atrophy is visualized. No intrinsic wasting. The patient has a positive Phalen's test. The patient has a positive Tinel's test. The patient has positive pinprick and light touch sensation in the median nerve distribution. There is normal grip strength and pincer strength. The patient has less than 2 second capillary refill with good skin warmth. Normal radial and ulnar pulse is palpated.   Neurologic: Sensory function is intact, except as noted above. Motor strength  is 5/5, except as noted above. No tremor or clonus is present. Good motor coordination is noted.   Imaging: None  Impression: 1. Right carpal tunnel syndrome.  Plan:  1. Treatment options were discussed today with the patient. 2. The patient is scheduled for a right endoscopic carpal tunnel release with Dr. Edie on 09/21/24. 3. The patient was instructed on the risk and benefits of surgical intervention and wishes to proceed at this time 4. This document will serve as a surgical history and physical for the patient. 5. The patient will follow-up per standard postop protocol. They can call the clinic they have any questions, new symptoms develop or symptoms worsen.  The procedure was discussed with the patient, as were the potential risks (including bleeding, infection, nerve and/or blood vessel injury, persistent or recurrent pain, failure of the release, continued numbness and tingling, progression of arthritis, need for further surgery, blood clots, strokes, heart attacks and/or arhythmias, pneumonia, etc.) and benefits. The patient states her understanding and wishes to proceed.

## 2024-09-22 ENCOUNTER — Encounter: Payer: Self-pay | Admitting: Surgery

## 2024-09-22 NOTE — Anesthesia Postprocedure Evaluation (Addendum)
 Anesthesia Post Note  Patient: Sandra Sanchez  Procedure(s) Performed: RELEASE, CARPAL TUNNEL, ENDOSCOPIC (Right: Wrist)  Patient location during evaluation: PACU Anesthesia Type: General Level of consciousness: awake and alert Pain management: pain level controlled Vital Signs Assessment: post-procedure vital signs reviewed and stable Respiratory status: spontaneous breathing, nonlabored ventilation and respiratory function stable Cardiovascular status: blood pressure returned to baseline and stable Postop Assessment: no apparent nausea or vomiting Anesthetic complications: no   No notable events documented.   Last Vitals:  Vitals:   09/21/24 1500 09/21/24 1533  BP: 118/70 (!) 148/85  Pulse: 85 93  Resp: 18 16  Temp: 36.6 C 36.6 C  SpO2: 100% 100%    Last Pain:  Vitals:   09/21/24 1533  TempSrc: Temporal  PainSc: 0-No pain                 Camellia Merilee Louder

## 2024-09-24 ENCOUNTER — Other Ambulatory Visit: Payer: Self-pay

## 2024-09-26 ENCOUNTER — Other Ambulatory Visit: Payer: Self-pay

## 2024-09-28 ENCOUNTER — Inpatient Hospital Stay
Admission: RE | Admit: 2024-09-28 | Discharge: 2024-09-28 | Payer: Self-pay | Attending: Medical Genetics | Admitting: Medical Genetics

## 2024-10-09 LAB — GENECONNECT MOLECULAR SCREEN: Genetic Analysis Overall Interpretation: NEGATIVE

## 2024-11-03 ENCOUNTER — Ambulatory Visit
Admission: RE | Admit: 2024-11-03 | Discharge: 2024-11-03 | Disposition: A | Discharge status: Home / Self Care | Source: Ambulatory Visit | Attending: Physician Assistant | Admitting: Physician Assistant

## 2024-11-03 DIAGNOSIS — Z1231 Encounter for screening mammogram for malignant neoplasm of breast: Secondary | ICD-10-CM | POA: Insufficient documentation

## 2024-11-04 ENCOUNTER — Inpatient Hospital Stay
Admission: RE | Admit: 2024-11-04 | Discharge: 2024-11-04 | Disposition: A | Discharge status: Home / Self Care | Payer: Self-pay | Source: Ambulatory Visit | Attending: Physician Assistant | Admitting: Physician Assistant

## 2024-11-04 ENCOUNTER — Other Ambulatory Visit: Payer: Self-pay | Admitting: *Deleted

## 2024-11-04 DIAGNOSIS — Z1231 Encounter for screening mammogram for malignant neoplasm of breast: Secondary | ICD-10-CM

## 2024-11-15 ENCOUNTER — Other Ambulatory Visit: Payer: Self-pay | Admitting: Family Medicine

## 2024-11-15 DIAGNOSIS — R928 Other abnormal and inconclusive findings on diagnostic imaging of breast: Secondary | ICD-10-CM

## 2024-11-15 DIAGNOSIS — N63 Unspecified lump in unspecified breast: Secondary | ICD-10-CM

## 2024-11-21 ENCOUNTER — Other Ambulatory Visit

## 2024-11-30 ENCOUNTER — Other Ambulatory Visit
